# Patient Record
Sex: Male | Born: 1940
Health system: Southern US, Community
[De-identification: ages and names within clinical notes are randomized; demographics above are authoritative.]

## PROBLEM LIST (undated history)

## (undated) DIAGNOSIS — I10 Essential (primary) hypertension: Secondary | ICD-10-CM

## (undated) DIAGNOSIS — K219 Gastro-esophageal reflux disease without esophagitis: Secondary | ICD-10-CM

## (undated) DIAGNOSIS — E7800 Pure hypercholesterolemia, unspecified: Secondary | ICD-10-CM

## (undated) DIAGNOSIS — M1990 Unspecified osteoarthritis, unspecified site: Secondary | ICD-10-CM

## (undated) DIAGNOSIS — E119 Type 2 diabetes mellitus without complications: Secondary | ICD-10-CM

## (undated) HISTORY — PX: COLONOSCOPY: SHX174

## (undated) HISTORY — PX: JOINT REPLACEMENT: SHX530

---

## 2003-02-17 DIAGNOSIS — E139 Other specified diabetes mellitus without complications: Secondary | ICD-10-CM | POA: Insufficient documentation

## 2004-12-26 ENCOUNTER — Ambulatory Visit (HOSPITAL_COMMUNITY): Admission: RE | Admit: 2004-12-26 | Discharge: 2004-12-26 | Payer: Self-pay | Admitting: Gastroenterology

## 2011-09-11 DIAGNOSIS — H2510 Age-related nuclear cataract, unspecified eye: Secondary | ICD-10-CM | POA: Diagnosis not present

## 2011-10-22 DIAGNOSIS — K219 Gastro-esophageal reflux disease without esophagitis: Secondary | ICD-10-CM | POA: Diagnosis not present

## 2011-10-22 DIAGNOSIS — E119 Type 2 diabetes mellitus without complications: Secondary | ICD-10-CM | POA: Diagnosis not present

## 2011-10-22 DIAGNOSIS — E782 Mixed hyperlipidemia: Secondary | ICD-10-CM | POA: Diagnosis not present

## 2011-10-22 DIAGNOSIS — E559 Vitamin D deficiency, unspecified: Secondary | ICD-10-CM | POA: Diagnosis not present

## 2011-10-22 DIAGNOSIS — I10 Essential (primary) hypertension: Secondary | ICD-10-CM | POA: Diagnosis not present

## 2011-10-22 DIAGNOSIS — R809 Proteinuria, unspecified: Secondary | ICD-10-CM | POA: Diagnosis not present

## 2011-10-22 DIAGNOSIS — T783XXA Angioneurotic edema, initial encounter: Secondary | ICD-10-CM | POA: Diagnosis not present

## 2011-10-24 DIAGNOSIS — K219 Gastro-esophageal reflux disease without esophagitis: Secondary | ICD-10-CM | POA: Diagnosis not present

## 2011-10-24 DIAGNOSIS — M169 Osteoarthritis of hip, unspecified: Secondary | ICD-10-CM | POA: Diagnosis not present

## 2011-10-24 DIAGNOSIS — I10 Essential (primary) hypertension: Secondary | ICD-10-CM | POA: Diagnosis not present

## 2011-10-24 DIAGNOSIS — E119 Type 2 diabetes mellitus without complications: Secondary | ICD-10-CM | POA: Diagnosis not present

## 2011-10-24 DIAGNOSIS — E782 Mixed hyperlipidemia: Secondary | ICD-10-CM | POA: Diagnosis not present

## 2011-10-24 DIAGNOSIS — M1710 Unilateral primary osteoarthritis, unspecified knee: Secondary | ICD-10-CM | POA: Diagnosis not present

## 2012-03-12 DIAGNOSIS — H2510 Age-related nuclear cataract, unspecified eye: Secondary | ICD-10-CM | POA: Diagnosis not present

## 2012-03-30 DIAGNOSIS — D1039 Benign neoplasm of other parts of mouth: Secondary | ICD-10-CM | POA: Diagnosis not present

## 2012-04-06 DIAGNOSIS — D1039 Benign neoplasm of other parts of mouth: Secondary | ICD-10-CM | POA: Diagnosis not present

## 2012-04-11 DIAGNOSIS — D1039 Benign neoplasm of other parts of mouth: Secondary | ICD-10-CM | POA: Diagnosis not present

## 2012-04-24 DIAGNOSIS — M1710 Unilateral primary osteoarthritis, unspecified knee: Secondary | ICD-10-CM | POA: Diagnosis not present

## 2012-04-24 DIAGNOSIS — M169 Osteoarthritis of hip, unspecified: Secondary | ICD-10-CM | POA: Diagnosis not present

## 2012-04-24 DIAGNOSIS — E782 Mixed hyperlipidemia: Secondary | ICD-10-CM | POA: Diagnosis not present

## 2012-04-24 DIAGNOSIS — I10 Essential (primary) hypertension: Secondary | ICD-10-CM | POA: Diagnosis not present

## 2012-04-24 DIAGNOSIS — E119 Type 2 diabetes mellitus without complications: Secondary | ICD-10-CM | POA: Diagnosis not present

## 2012-04-24 DIAGNOSIS — K219 Gastro-esophageal reflux disease without esophagitis: Secondary | ICD-10-CM | POA: Diagnosis not present

## 2012-04-28 DIAGNOSIS — Z23 Encounter for immunization: Secondary | ICD-10-CM | POA: Diagnosis not present

## 2012-04-28 DIAGNOSIS — E782 Mixed hyperlipidemia: Secondary | ICD-10-CM | POA: Diagnosis not present

## 2012-04-28 DIAGNOSIS — H269 Unspecified cataract: Secondary | ICD-10-CM | POA: Diagnosis not present

## 2012-04-28 DIAGNOSIS — E119 Type 2 diabetes mellitus without complications: Secondary | ICD-10-CM | POA: Diagnosis not present

## 2012-04-28 DIAGNOSIS — K219 Gastro-esophageal reflux disease without esophagitis: Secondary | ICD-10-CM | POA: Diagnosis not present

## 2012-04-28 DIAGNOSIS — B360 Pityriasis versicolor: Secondary | ICD-10-CM | POA: Diagnosis not present

## 2012-04-28 DIAGNOSIS — I10 Essential (primary) hypertension: Secondary | ICD-10-CM | POA: Diagnosis not present

## 2012-07-02 DIAGNOSIS — H2510 Age-related nuclear cataract, unspecified eye: Secondary | ICD-10-CM | POA: Diagnosis not present

## 2012-07-13 DIAGNOSIS — H2510 Age-related nuclear cataract, unspecified eye: Secondary | ICD-10-CM | POA: Diagnosis not present

## 2012-08-05 DIAGNOSIS — H2589 Other age-related cataract: Secondary | ICD-10-CM | POA: Diagnosis not present

## 2012-08-05 DIAGNOSIS — H269 Unspecified cataract: Secondary | ICD-10-CM | POA: Diagnosis not present

## 2012-08-05 DIAGNOSIS — H2510 Age-related nuclear cataract, unspecified eye: Secondary | ICD-10-CM | POA: Diagnosis not present

## 2012-09-17 DIAGNOSIS — H2510 Age-related nuclear cataract, unspecified eye: Secondary | ICD-10-CM | POA: Diagnosis not present

## 2012-09-28 DIAGNOSIS — H269 Unspecified cataract: Secondary | ICD-10-CM | POA: Diagnosis not present

## 2012-09-28 DIAGNOSIS — H2589 Other age-related cataract: Secondary | ICD-10-CM | POA: Diagnosis not present

## 2012-09-28 DIAGNOSIS — H2510 Age-related nuclear cataract, unspecified eye: Secondary | ICD-10-CM | POA: Diagnosis not present

## 2012-10-13 DIAGNOSIS — R071 Chest pain on breathing: Secondary | ICD-10-CM | POA: Diagnosis not present

## 2012-10-13 DIAGNOSIS — Z0184 Encounter for antibody response examination: Secondary | ICD-10-CM | POA: Diagnosis not present

## 2012-10-21 DIAGNOSIS — R071 Chest pain on breathing: Secondary | ICD-10-CM | POA: Diagnosis not present

## 2012-10-21 DIAGNOSIS — E119 Type 2 diabetes mellitus without complications: Secondary | ICD-10-CM | POA: Diagnosis not present

## 2012-10-21 DIAGNOSIS — E782 Mixed hyperlipidemia: Secondary | ICD-10-CM | POA: Diagnosis not present

## 2012-10-26 DIAGNOSIS — R809 Proteinuria, unspecified: Secondary | ICD-10-CM | POA: Diagnosis not present

## 2012-10-26 DIAGNOSIS — M169 Osteoarthritis of hip, unspecified: Secondary | ICD-10-CM | POA: Diagnosis not present

## 2012-10-26 DIAGNOSIS — R071 Chest pain on breathing: Secondary | ICD-10-CM | POA: Diagnosis not present

## 2012-10-26 DIAGNOSIS — K219 Gastro-esophageal reflux disease without esophagitis: Secondary | ICD-10-CM | POA: Diagnosis not present

## 2012-10-26 DIAGNOSIS — M1710 Unilateral primary osteoarthritis, unspecified knee: Secondary | ICD-10-CM | POA: Diagnosis not present

## 2012-10-26 DIAGNOSIS — I10 Essential (primary) hypertension: Secondary | ICD-10-CM | POA: Diagnosis not present

## 2012-10-26 DIAGNOSIS — E782 Mixed hyperlipidemia: Secondary | ICD-10-CM | POA: Diagnosis not present

## 2013-01-26 DIAGNOSIS — R809 Proteinuria, unspecified: Secondary | ICD-10-CM | POA: Diagnosis not present

## 2013-05-03 DIAGNOSIS — R809 Proteinuria, unspecified: Secondary | ICD-10-CM | POA: Diagnosis not present

## 2013-05-03 DIAGNOSIS — E119 Type 2 diabetes mellitus without complications: Secondary | ICD-10-CM | POA: Diagnosis not present

## 2013-05-03 DIAGNOSIS — R071 Chest pain on breathing: Secondary | ICD-10-CM | POA: Diagnosis not present

## 2013-05-03 DIAGNOSIS — K219 Gastro-esophageal reflux disease without esophagitis: Secondary | ICD-10-CM | POA: Diagnosis not present

## 2013-05-03 DIAGNOSIS — E559 Vitamin D deficiency, unspecified: Secondary | ICD-10-CM | POA: Diagnosis not present

## 2013-05-03 DIAGNOSIS — I10 Essential (primary) hypertension: Secondary | ICD-10-CM | POA: Diagnosis not present

## 2013-05-03 DIAGNOSIS — E782 Mixed hyperlipidemia: Secondary | ICD-10-CM | POA: Diagnosis not present

## 2013-05-05 DIAGNOSIS — E119 Type 2 diabetes mellitus without complications: Secondary | ICD-10-CM | POA: Diagnosis not present

## 2013-05-10 DIAGNOSIS — Z86010 Personal history of colonic polyps: Secondary | ICD-10-CM | POA: Diagnosis not present

## 2013-05-10 DIAGNOSIS — Z23 Encounter for immunization: Secondary | ICD-10-CM | POA: Diagnosis not present

## 2013-05-10 DIAGNOSIS — Z125 Encounter for screening for malignant neoplasm of prostate: Secondary | ICD-10-CM | POA: Diagnosis not present

## 2013-05-10 DIAGNOSIS — Z0000 Encounter for general adult medical examination without abnormal findings: Secondary | ICD-10-CM | POA: Diagnosis not present

## 2013-05-10 DIAGNOSIS — I10 Essential (primary) hypertension: Secondary | ICD-10-CM | POA: Diagnosis not present

## 2013-05-10 DIAGNOSIS — E782 Mixed hyperlipidemia: Secondary | ICD-10-CM | POA: Diagnosis not present

## 2013-05-10 DIAGNOSIS — E119 Type 2 diabetes mellitus without complications: Secondary | ICD-10-CM | POA: Diagnosis not present

## 2013-05-10 DIAGNOSIS — K219 Gastro-esophageal reflux disease without esophagitis: Secondary | ICD-10-CM | POA: Diagnosis not present

## 2013-05-14 DIAGNOSIS — H33329 Round hole, unspecified eye: Secondary | ICD-10-CM | POA: Diagnosis not present

## 2013-05-14 DIAGNOSIS — H35419 Lattice degeneration of retina, unspecified eye: Secondary | ICD-10-CM | POA: Diagnosis not present

## 2013-05-14 DIAGNOSIS — H43819 Vitreous degeneration, unspecified eye: Secondary | ICD-10-CM | POA: Diagnosis not present

## 2013-05-25 DIAGNOSIS — H33329 Round hole, unspecified eye: Secondary | ICD-10-CM | POA: Diagnosis not present

## 2013-06-08 DIAGNOSIS — H33329 Round hole, unspecified eye: Secondary | ICD-10-CM | POA: Diagnosis not present

## 2013-06-22 DIAGNOSIS — H33329 Round hole, unspecified eye: Secondary | ICD-10-CM | POA: Diagnosis not present

## 2013-11-01 DIAGNOSIS — I10 Essential (primary) hypertension: Secondary | ICD-10-CM | POA: Diagnosis not present

## 2013-11-01 DIAGNOSIS — E559 Vitamin D deficiency, unspecified: Secondary | ICD-10-CM | POA: Diagnosis not present

## 2013-11-01 DIAGNOSIS — E782 Mixed hyperlipidemia: Secondary | ICD-10-CM | POA: Diagnosis not present

## 2013-11-01 DIAGNOSIS — E119 Type 2 diabetes mellitus without complications: Secondary | ICD-10-CM | POA: Diagnosis not present

## 2013-11-01 DIAGNOSIS — IMO0001 Reserved for inherently not codable concepts without codable children: Secondary | ICD-10-CM | POA: Diagnosis not present

## 2013-11-01 DIAGNOSIS — M1610 Unilateral primary osteoarthritis, unspecified hip: Secondary | ICD-10-CM | POA: Diagnosis not present

## 2013-11-01 DIAGNOSIS — R809 Proteinuria, unspecified: Secondary | ICD-10-CM | POA: Diagnosis not present

## 2013-11-01 DIAGNOSIS — M169 Osteoarthritis of hip, unspecified: Secondary | ICD-10-CM | POA: Diagnosis not present

## 2013-11-04 DIAGNOSIS — H26499 Other secondary cataract, unspecified eye: Secondary | ICD-10-CM | POA: Diagnosis not present

## 2013-11-04 DIAGNOSIS — E119 Type 2 diabetes mellitus without complications: Secondary | ICD-10-CM | POA: Diagnosis not present

## 2013-11-10 DIAGNOSIS — E119 Type 2 diabetes mellitus without complications: Secondary | ICD-10-CM | POA: Diagnosis not present

## 2013-11-10 DIAGNOSIS — E782 Mixed hyperlipidemia: Secondary | ICD-10-CM | POA: Diagnosis not present

## 2013-11-10 DIAGNOSIS — M1710 Unilateral primary osteoarthritis, unspecified knee: Secondary | ICD-10-CM | POA: Diagnosis not present

## 2013-11-10 DIAGNOSIS — Z23 Encounter for immunization: Secondary | ICD-10-CM | POA: Diagnosis not present

## 2013-11-10 DIAGNOSIS — K219 Gastro-esophageal reflux disease without esophagitis: Secondary | ICD-10-CM | POA: Diagnosis not present

## 2013-11-10 DIAGNOSIS — I10 Essential (primary) hypertension: Secondary | ICD-10-CM | POA: Diagnosis not present

## 2014-01-07 DIAGNOSIS — J209 Acute bronchitis, unspecified: Secondary | ICD-10-CM | POA: Diagnosis not present

## 2014-01-13 DIAGNOSIS — J329 Chronic sinusitis, unspecified: Secondary | ICD-10-CM | POA: Diagnosis not present

## 2014-01-17 DIAGNOSIS — S30860A Insect bite (nonvenomous) of lower back and pelvis, initial encounter: Secondary | ICD-10-CM | POA: Diagnosis not present

## 2014-01-17 DIAGNOSIS — R1310 Dysphagia, unspecified: Secondary | ICD-10-CM | POA: Diagnosis not present

## 2014-01-26 DIAGNOSIS — R1314 Dysphagia, pharyngoesophageal phase: Secondary | ICD-10-CM | POA: Diagnosis not present

## 2014-02-03 DIAGNOSIS — K2100 Gastro-esophageal reflux disease with esophagitis, without bleeding: Secondary | ICD-10-CM | POA: Diagnosis not present

## 2014-02-03 DIAGNOSIS — R1310 Dysphagia, unspecified: Secondary | ICD-10-CM | POA: Diagnosis not present

## 2014-02-03 DIAGNOSIS — K222 Esophageal obstruction: Secondary | ICD-10-CM | POA: Diagnosis not present

## 2014-04-11 DIAGNOSIS — J069 Acute upper respiratory infection, unspecified: Secondary | ICD-10-CM | POA: Diagnosis not present

## 2014-04-26 DIAGNOSIS — J40 Bronchitis, not specified as acute or chronic: Secondary | ICD-10-CM | POA: Diagnosis not present

## 2014-05-09 DIAGNOSIS — I10 Essential (primary) hypertension: Secondary | ICD-10-CM | POA: Diagnosis not present

## 2014-05-09 DIAGNOSIS — Z0000 Encounter for general adult medical examination without abnormal findings: Secondary | ICD-10-CM | POA: Diagnosis not present

## 2014-05-09 DIAGNOSIS — R809 Proteinuria, unspecified: Secondary | ICD-10-CM | POA: Diagnosis not present

## 2014-05-09 DIAGNOSIS — R82998 Other abnormal findings in urine: Secondary | ICD-10-CM | POA: Diagnosis not present

## 2014-05-09 DIAGNOSIS — E782 Mixed hyperlipidemia: Secondary | ICD-10-CM | POA: Diagnosis not present

## 2014-05-09 DIAGNOSIS — E559 Vitamin D deficiency, unspecified: Secondary | ICD-10-CM | POA: Diagnosis not present

## 2014-05-09 DIAGNOSIS — E119 Type 2 diabetes mellitus without complications: Secondary | ICD-10-CM | POA: Diagnosis not present

## 2014-05-09 DIAGNOSIS — IMO0001 Reserved for inherently not codable concepts without codable children: Secondary | ICD-10-CM | POA: Diagnosis not present

## 2014-05-13 DIAGNOSIS — I10 Essential (primary) hypertension: Secondary | ICD-10-CM | POA: Diagnosis not present

## 2014-05-13 DIAGNOSIS — K219 Gastro-esophageal reflux disease without esophagitis: Secondary | ICD-10-CM | POA: Diagnosis not present

## 2014-05-13 DIAGNOSIS — E782 Mixed hyperlipidemia: Secondary | ICD-10-CM | POA: Diagnosis not present

## 2014-05-13 DIAGNOSIS — Z0000 Encounter for general adult medical examination without abnormal findings: Secondary | ICD-10-CM | POA: Diagnosis not present

## 2014-05-13 DIAGNOSIS — Z86010 Personal history of colonic polyps: Secondary | ICD-10-CM | POA: Diagnosis not present

## 2014-05-13 DIAGNOSIS — I861 Scrotal varices: Secondary | ICD-10-CM | POA: Diagnosis not present

## 2014-05-13 DIAGNOSIS — Z23 Encounter for immunization: Secondary | ICD-10-CM | POA: Diagnosis not present

## 2014-05-13 DIAGNOSIS — Z1331 Encounter for screening for depression: Secondary | ICD-10-CM | POA: Diagnosis not present

## 2014-05-13 DIAGNOSIS — E119 Type 2 diabetes mellitus without complications: Secondary | ICD-10-CM | POA: Diagnosis not present

## 2014-11-18 DIAGNOSIS — E1165 Type 2 diabetes mellitus with hyperglycemia: Secondary | ICD-10-CM | POA: Diagnosis not present

## 2014-11-18 DIAGNOSIS — B360 Pityriasis versicolor: Secondary | ICD-10-CM | POA: Diagnosis not present

## 2014-11-18 DIAGNOSIS — E119 Type 2 diabetes mellitus without complications: Secondary | ICD-10-CM | POA: Diagnosis not present

## 2014-11-18 DIAGNOSIS — I10 Essential (primary) hypertension: Secondary | ICD-10-CM | POA: Diagnosis not present

## 2014-11-18 DIAGNOSIS — K219 Gastro-esophageal reflux disease without esophagitis: Secondary | ICD-10-CM | POA: Diagnosis not present

## 2014-11-18 DIAGNOSIS — E782 Mixed hyperlipidemia: Secondary | ICD-10-CM | POA: Diagnosis not present

## 2014-11-18 DIAGNOSIS — Z86010 Personal history of colonic polyps: Secondary | ICD-10-CM | POA: Diagnosis not present

## 2014-11-23 DIAGNOSIS — E782 Mixed hyperlipidemia: Secondary | ICD-10-CM | POA: Diagnosis not present

## 2014-11-23 DIAGNOSIS — I10 Essential (primary) hypertension: Secondary | ICD-10-CM | POA: Diagnosis not present

## 2014-11-23 DIAGNOSIS — E119 Type 2 diabetes mellitus without complications: Secondary | ICD-10-CM | POA: Diagnosis not present

## 2014-11-23 DIAGNOSIS — Z86010 Personal history of colonic polyps: Secondary | ICD-10-CM | POA: Diagnosis not present

## 2014-11-23 DIAGNOSIS — M179 Osteoarthritis of knee, unspecified: Secondary | ICD-10-CM | POA: Diagnosis not present

## 2014-11-23 DIAGNOSIS — K219 Gastro-esophageal reflux disease without esophagitis: Secondary | ICD-10-CM | POA: Diagnosis not present

## 2014-12-13 DIAGNOSIS — H26493 Other secondary cataract, bilateral: Secondary | ICD-10-CM | POA: Diagnosis not present

## 2014-12-13 DIAGNOSIS — H35033 Hypertensive retinopathy, bilateral: Secondary | ICD-10-CM | POA: Diagnosis not present

## 2014-12-13 DIAGNOSIS — Z961 Presence of intraocular lens: Secondary | ICD-10-CM | POA: Diagnosis not present

## 2014-12-13 DIAGNOSIS — E119 Type 2 diabetes mellitus without complications: Secondary | ICD-10-CM | POA: Diagnosis not present

## 2015-03-20 DIAGNOSIS — Z09 Encounter for follow-up examination after completed treatment for conditions other than malignant neoplasm: Secondary | ICD-10-CM | POA: Diagnosis not present

## 2015-03-20 DIAGNOSIS — Z86010 Personal history of colonic polyps: Secondary | ICD-10-CM | POA: Diagnosis not present

## 2015-05-29 DIAGNOSIS — E1165 Type 2 diabetes mellitus with hyperglycemia: Secondary | ICD-10-CM | POA: Diagnosis not present

## 2015-05-29 DIAGNOSIS — I10 Essential (primary) hypertension: Secondary | ICD-10-CM | POA: Diagnosis not present

## 2015-05-29 DIAGNOSIS — B360 Pityriasis versicolor: Secondary | ICD-10-CM | POA: Diagnosis not present

## 2015-05-29 DIAGNOSIS — K219 Gastro-esophageal reflux disease without esophagitis: Secondary | ICD-10-CM | POA: Diagnosis not present

## 2015-05-29 DIAGNOSIS — E782 Mixed hyperlipidemia: Secondary | ICD-10-CM | POA: Diagnosis not present

## 2015-05-29 DIAGNOSIS — Z86010 Personal history of colonic polyps: Secondary | ICD-10-CM | POA: Diagnosis not present

## 2015-05-31 DIAGNOSIS — Z0000 Encounter for general adult medical examination without abnormal findings: Secondary | ICD-10-CM | POA: Diagnosis not present

## 2015-05-31 DIAGNOSIS — K219 Gastro-esophageal reflux disease without esophagitis: Secondary | ICD-10-CM | POA: Diagnosis not present

## 2015-05-31 DIAGNOSIS — Z1389 Encounter for screening for other disorder: Secondary | ICD-10-CM | POA: Diagnosis not present

## 2015-05-31 DIAGNOSIS — I10 Essential (primary) hypertension: Secondary | ICD-10-CM | POA: Diagnosis not present

## 2015-05-31 DIAGNOSIS — R809 Proteinuria, unspecified: Secondary | ICD-10-CM | POA: Diagnosis not present

## 2015-05-31 DIAGNOSIS — Z7189 Other specified counseling: Secondary | ICD-10-CM | POA: Diagnosis not present

## 2015-05-31 DIAGNOSIS — Z23 Encounter for immunization: Secondary | ICD-10-CM | POA: Diagnosis not present

## 2015-05-31 DIAGNOSIS — M179 Osteoarthritis of knee, unspecified: Secondary | ICD-10-CM | POA: Diagnosis not present

## 2015-05-31 DIAGNOSIS — E11319 Type 2 diabetes mellitus with unspecified diabetic retinopathy without macular edema: Secondary | ICD-10-CM | POA: Diagnosis not present

## 2015-05-31 DIAGNOSIS — E782 Mixed hyperlipidemia: Secondary | ICD-10-CM | POA: Diagnosis not present

## 2015-06-05 DIAGNOSIS — M170 Bilateral primary osteoarthritis of knee: Secondary | ICD-10-CM | POA: Diagnosis not present

## 2015-06-05 DIAGNOSIS — M25551 Pain in right hip: Secondary | ICD-10-CM | POA: Diagnosis not present

## 2015-06-15 DIAGNOSIS — M545 Low back pain: Secondary | ICD-10-CM | POA: Diagnosis not present

## 2015-06-19 DIAGNOSIS — M545 Low back pain: Secondary | ICD-10-CM | POA: Diagnosis not present

## 2015-06-22 DIAGNOSIS — M545 Low back pain: Secondary | ICD-10-CM | POA: Diagnosis not present

## 2015-06-27 DIAGNOSIS — M9963 Osseous and subluxation stenosis of intervertebral foramina of lumbar region: Secondary | ICD-10-CM | POA: Diagnosis not present

## 2015-06-27 DIAGNOSIS — M545 Low back pain: Secondary | ICD-10-CM | POA: Diagnosis not present

## 2015-06-27 DIAGNOSIS — M170 Bilateral primary osteoarthritis of knee: Secondary | ICD-10-CM | POA: Diagnosis not present

## 2015-06-30 DIAGNOSIS — M545 Low back pain: Secondary | ICD-10-CM | POA: Diagnosis not present

## 2015-06-30 DIAGNOSIS — M9963 Osseous and subluxation stenosis of intervertebral foramina of lumbar region: Secondary | ICD-10-CM | POA: Diagnosis not present

## 2015-07-18 DIAGNOSIS — M9963 Osseous and subluxation stenosis of intervertebral foramina of lumbar region: Secondary | ICD-10-CM | POA: Diagnosis not present

## 2015-07-18 DIAGNOSIS — M545 Low back pain: Secondary | ICD-10-CM | POA: Diagnosis not present

## 2015-11-09 DIAGNOSIS — J209 Acute bronchitis, unspecified: Secondary | ICD-10-CM | POA: Diagnosis not present

## 2015-12-13 DIAGNOSIS — H5213 Myopia, bilateral: Secondary | ICD-10-CM | POA: Diagnosis not present

## 2015-12-13 DIAGNOSIS — H35033 Hypertensive retinopathy, bilateral: Secondary | ICD-10-CM | POA: Diagnosis not present

## 2015-12-13 DIAGNOSIS — E119 Type 2 diabetes mellitus without complications: Secondary | ICD-10-CM | POA: Diagnosis not present

## 2015-12-13 DIAGNOSIS — H26493 Other secondary cataract, bilateral: Secondary | ICD-10-CM | POA: Diagnosis not present

## 2015-12-13 DIAGNOSIS — H524 Presbyopia: Secondary | ICD-10-CM | POA: Diagnosis not present

## 2016-01-02 DIAGNOSIS — Z7984 Long term (current) use of oral hypoglycemic drugs: Secondary | ICD-10-CM | POA: Diagnosis not present

## 2016-01-02 DIAGNOSIS — M179 Osteoarthritis of knee, unspecified: Secondary | ICD-10-CM | POA: Diagnosis not present

## 2016-01-02 DIAGNOSIS — E11319 Type 2 diabetes mellitus with unspecified diabetic retinopathy without macular edema: Secondary | ICD-10-CM | POA: Diagnosis not present

## 2016-01-02 DIAGNOSIS — K219 Gastro-esophageal reflux disease without esophagitis: Secondary | ICD-10-CM | POA: Diagnosis not present

## 2016-01-02 DIAGNOSIS — I10 Essential (primary) hypertension: Secondary | ICD-10-CM | POA: Diagnosis not present

## 2016-01-02 DIAGNOSIS — Z0000 Encounter for general adult medical examination without abnormal findings: Secondary | ICD-10-CM | POA: Diagnosis not present

## 2016-01-02 DIAGNOSIS — Z7189 Other specified counseling: Secondary | ICD-10-CM | POA: Diagnosis not present

## 2016-01-02 DIAGNOSIS — Z23 Encounter for immunization: Secondary | ICD-10-CM | POA: Diagnosis not present

## 2016-01-02 DIAGNOSIS — E782 Mixed hyperlipidemia: Secondary | ICD-10-CM | POA: Diagnosis not present

## 2016-01-02 DIAGNOSIS — R809 Proteinuria, unspecified: Secondary | ICD-10-CM | POA: Diagnosis not present

## 2016-01-02 DIAGNOSIS — Z1389 Encounter for screening for other disorder: Secondary | ICD-10-CM | POA: Diagnosis not present

## 2016-01-05 DIAGNOSIS — K219 Gastro-esophageal reflux disease without esophagitis: Secondary | ICD-10-CM | POA: Diagnosis not present

## 2016-01-05 DIAGNOSIS — I10 Essential (primary) hypertension: Secondary | ICD-10-CM | POA: Diagnosis not present

## 2016-01-05 DIAGNOSIS — E11319 Type 2 diabetes mellitus with unspecified diabetic retinopathy without macular edema: Secondary | ICD-10-CM | POA: Diagnosis not present

## 2016-01-05 DIAGNOSIS — Z7984 Long term (current) use of oral hypoglycemic drugs: Secondary | ICD-10-CM | POA: Diagnosis not present

## 2016-01-05 DIAGNOSIS — E782 Mixed hyperlipidemia: Secondary | ICD-10-CM | POA: Diagnosis not present

## 2016-01-05 DIAGNOSIS — M179 Osteoarthritis of knee, unspecified: Secondary | ICD-10-CM | POA: Diagnosis not present

## 2016-01-05 DIAGNOSIS — Z86010 Personal history of colonic polyps: Secondary | ICD-10-CM | POA: Diagnosis not present

## 2016-01-05 DIAGNOSIS — R809 Proteinuria, unspecified: Secondary | ICD-10-CM | POA: Diagnosis not present

## 2016-03-14 DIAGNOSIS — T63481A Toxic effect of venom of other arthropod, accidental (unintentional), initial encounter: Secondary | ICD-10-CM | POA: Diagnosis not present

## 2016-03-14 DIAGNOSIS — S40261A Insect bite (nonvenomous) of right shoulder, initial encounter: Secondary | ICD-10-CM | POA: Diagnosis not present

## 2016-06-03 DIAGNOSIS — E782 Mixed hyperlipidemia: Secondary | ICD-10-CM | POA: Diagnosis not present

## 2016-06-03 DIAGNOSIS — R809 Proteinuria, unspecified: Secondary | ICD-10-CM | POA: Diagnosis not present

## 2016-06-03 DIAGNOSIS — Z1389 Encounter for screening for other disorder: Secondary | ICD-10-CM | POA: Diagnosis not present

## 2016-06-03 DIAGNOSIS — I10 Essential (primary) hypertension: Secondary | ICD-10-CM | POA: Diagnosis not present

## 2016-06-03 DIAGNOSIS — E11319 Type 2 diabetes mellitus with unspecified diabetic retinopathy without macular edema: Secondary | ICD-10-CM | POA: Diagnosis not present

## 2016-06-03 DIAGNOSIS — Z23 Encounter for immunization: Secondary | ICD-10-CM | POA: Diagnosis not present

## 2016-06-03 DIAGNOSIS — K219 Gastro-esophageal reflux disease without esophagitis: Secondary | ICD-10-CM | POA: Diagnosis not present

## 2016-06-03 DIAGNOSIS — Z7984 Long term (current) use of oral hypoglycemic drugs: Secondary | ICD-10-CM | POA: Diagnosis not present

## 2016-06-03 DIAGNOSIS — Z7189 Other specified counseling: Secondary | ICD-10-CM | POA: Diagnosis not present

## 2016-06-03 DIAGNOSIS — Z0000 Encounter for general adult medical examination without abnormal findings: Secondary | ICD-10-CM | POA: Diagnosis not present

## 2016-06-03 DIAGNOSIS — M179 Osteoarthritis of knee, unspecified: Secondary | ICD-10-CM | POA: Diagnosis not present

## 2016-06-05 DIAGNOSIS — E11319 Type 2 diabetes mellitus with unspecified diabetic retinopathy without macular edema: Secondary | ICD-10-CM | POA: Diagnosis not present

## 2016-06-05 DIAGNOSIS — Z7984 Long term (current) use of oral hypoglycemic drugs: Secondary | ICD-10-CM | POA: Diagnosis not present

## 2016-06-05 DIAGNOSIS — E782 Mixed hyperlipidemia: Secondary | ICD-10-CM | POA: Diagnosis not present

## 2016-06-05 DIAGNOSIS — Z86010 Personal history of colonic polyps: Secondary | ICD-10-CM | POA: Diagnosis not present

## 2016-06-05 DIAGNOSIS — Z0000 Encounter for general adult medical examination without abnormal findings: Secondary | ICD-10-CM | POA: Diagnosis not present

## 2016-06-05 DIAGNOSIS — Z1389 Encounter for screening for other disorder: Secondary | ICD-10-CM | POA: Diagnosis not present

## 2016-06-05 DIAGNOSIS — Z23 Encounter for immunization: Secondary | ICD-10-CM | POA: Diagnosis not present

## 2016-06-05 DIAGNOSIS — R809 Proteinuria, unspecified: Secondary | ICD-10-CM | POA: Diagnosis not present

## 2016-06-05 DIAGNOSIS — K219 Gastro-esophageal reflux disease without esophagitis: Secondary | ICD-10-CM | POA: Diagnosis not present

## 2016-06-05 DIAGNOSIS — M169 Osteoarthritis of hip, unspecified: Secondary | ICD-10-CM | POA: Diagnosis not present

## 2016-06-05 DIAGNOSIS — I10 Essential (primary) hypertension: Secondary | ICD-10-CM | POA: Diagnosis not present

## 2016-07-08 DIAGNOSIS — K219 Gastro-esophageal reflux disease without esophagitis: Secondary | ICD-10-CM | POA: Diagnosis not present

## 2016-08-16 DIAGNOSIS — K219 Gastro-esophageal reflux disease without esophagitis: Secondary | ICD-10-CM | POA: Diagnosis not present

## 2016-09-11 ENCOUNTER — Ambulatory Visit (INDEPENDENT_AMBULATORY_CARE_PROVIDER_SITE_OTHER): Payer: Medicare Other

## 2016-09-11 ENCOUNTER — Ambulatory Visit (INDEPENDENT_AMBULATORY_CARE_PROVIDER_SITE_OTHER): Payer: Self-pay

## 2016-09-11 ENCOUNTER — Ambulatory Visit (INDEPENDENT_AMBULATORY_CARE_PROVIDER_SITE_OTHER): Payer: Medicare Other | Admitting: Orthopaedic Surgery

## 2016-09-11 ENCOUNTER — Encounter (INDEPENDENT_AMBULATORY_CARE_PROVIDER_SITE_OTHER): Payer: Self-pay | Admitting: Orthopaedic Surgery

## 2016-09-11 VITALS — Ht 69.0 in | Wt 185.0 lb

## 2016-09-11 DIAGNOSIS — M25562 Pain in left knee: Secondary | ICD-10-CM

## 2016-09-11 DIAGNOSIS — M1611 Unilateral primary osteoarthritis, right hip: Secondary | ICD-10-CM | POA: Diagnosis not present

## 2016-09-11 DIAGNOSIS — M25561 Pain in right knee: Secondary | ICD-10-CM

## 2016-09-11 DIAGNOSIS — M25551 Pain in right hip: Secondary | ICD-10-CM | POA: Diagnosis not present

## 2016-09-11 MED ORDER — LIDOCAINE HCL 1 % IJ SOLN
3.0000 mL | INTRAMUSCULAR | Status: AC | PRN
  Administered 2016-09-11: 3 mL

## 2016-09-11 MED ORDER — METHYLPREDNISOLONE ACETATE 40 MG/ML IJ SUSP
40.0000 mg | INTRAMUSCULAR | Status: AC | PRN
  Administered 2016-09-11: 40 mg via INTRA_ARTICULAR

## 2016-09-11 NOTE — Progress Notes (Signed)
Office Visit Note   Patient: Alex Carlson           Date of Birth: 12-15-1940           MRN: [id number] Visit Date: 09/11/2016              Requested by: No referring provider defined for this encounter. PCP: No primary care provider on file.   Assessment & Plan: Visit Diagnoses:  1. Pain in right hip   2. Acute pain of left knee   3. Acute pain of right knee   4. Unilateral primary osteoarthritis, right hip     Plan: At this point I did show him a hip model and spine in detail with the disease process is. We went over his x-rays as well. He does wish proceed with a total hip arthroplasty we've recommended this to him. We explained in great detail the risk of acute blood loss anemia, nerve and vessel injury, fracture, infection, dislocation, DVT. We talked about our goals are decreased pain, improve mobility, and improved quality of life. He does wish to have this set up in the near future. He will watch his blood glucose closely given the injections in his knees. He is definitely incision the surgery and we will see him back in 2 weeks postoperative but no x-rays are needed.  Follow-Up Instructions: Return for 2 weeks post-op.   Orders:  Orders Placed This Encounter  Procedures  . XR HIP UNILAT W OR W/O PELVIS 2-3 VIEWS RIGHT  . XR Knee 1-2 Views Left  . XR Knee 1-2 Views Right   No orders of the defined types were placed in this encounter.     Procedures: Large Joint Inj Date/Time: 09/11/2016 9:31 AM Performed by: Mcarthur Rossetti Authorized by: Jean Rosenthal Y   Location:  Knee Site:  R knee Ultrasound Guidance: No   Fluoroscopic Guidance: No   Arthrogram: No   Medications:  3 mL lidocaine 1 %; 40 mg methylPREDNISolone acetate 40 MG/ML Large Joint Inj Date/Time: 09/11/2016 9:31 AM Performed by: Mcarthur Rossetti Authorized by: Mcarthur Rossetti   Location:  Knee Site:  L knee Ultrasound Guidance: No   Fluoroscopic Guidance: No     Arthrogram: No   Medications:  3 mL lidocaine 1 %; 40 mg methylPREDNISolone acetate 40 MG/ML     Clinical Data: No additional findings.   Subjective: Chief Complaint  Patient presents with  . Right Hip - Pain    -states tht right hip hurts to walk -Bil. Knee pain, left worse than the right -left knee gives away, right knee has a sharp pain that radiates down into right ankle  . Right Knee - Pain  . Left Knee - Pain    HPI Most his pain seems to be in the right hip. It hurts in the groin. He walks with a significant limp. His pain is 10 out of 10. It's definitely affect his activities daily living, his quality of life, and his mobility. Both his knees have pain and he points the medial side of his knees. He has tried and failed all forms conservative treatment. He has ambulate with a cane and sometimes. It wakes him up at night. Review of Systems He denies any chest pain, shortness of breath, fever, chills, nausea, vomiting  He is a diabetic and is him ago and A1c is below 8.  Objective: Vital Signs: Ht 5\' 9"  (1.753 m)   Wt 185 lb (83.9 kg)  BMI 27.32 kg/m   Physical Exam He is alert and oriented 3 with no acute distress. He and relates with a significant limp. Ortho Exam He has significant stiffness of both hips with the right being worse than left. He has significant limitations with internal/external rotation and severe pain in the groin. His leg lengths appear equal. He may be just be a touch shorter on the right and left. Both knees have no significant patellofemoral crepitation but some slight medial joint line tenderness. There is no effusion in either knee. He has excellent range of motion of both knees. Specialty Comments:  No specialty comments available.  Imaging: Xr Hip Unilat W Or W/o Pelvis 2-3 Views Right  Result Date: 09/11/2016 An AP pelvis and lateral of his right hip shows severe end-stage arthritis. There is complete loss of superior lateral joint  space. There sclerotic and cystic changes in both the femoral head and the acetabulum.  Xr Knee 1-2 Views Left  Result Date: 09/11/2016 An AP and lateral of the left knee shows medial joint space narrowing and moderate try confirm arthritic changes but mainly the medial joint line. There is no effusion in no acute changes.  Xr Knee 1-2 Views Right  Result Date: 09/11/2016 An AP and lateral of his right knee shows moderate try confirm arthritis mainly of the medial compartment. He has a slight varus deformity and no effusion.    PMFS History: There are no active problems to display for this patient.  Past Medical History:  Diagnosis Date  . Cancer [facility])    family  . Diabetes mellitus without complication (HCC)    family    No family history on file.  History reviewed. No pertinent surgical history. Social History   Occupational History  . Not on file.   Social History Main Topics  . Smoking status: Never Smoker  . Smokeless tobacco: Never Used  . Alcohol use Yes  . Drug use: No  . Sexual activity: Not on file

## 2016-09-26 ENCOUNTER — Other Ambulatory Visit (INDEPENDENT_AMBULATORY_CARE_PROVIDER_SITE_OTHER): Payer: Self-pay | Admitting: Physician Assistant

## 2016-09-27 NOTE — Pre-Procedure Instructions (Signed)
Alex Carlson  09/27/2016      [address], [place] [address] Phone: [phone] Fax: [phone]    Your procedure is scheduled on Tuesday February 20.  Report to [facility] Admitting at 11:00 A.M.  Call this number if you have problems the morning of surgery:  [phone]   Remember:  Do not eat food or drink liquids after midnight.  Take these medicines the morning of surgery with A SIP OF WATER: amlodipine (norvasc), acetaminophen (tylenol) if needed  7 days prior to surgery STOP taking any Aspirin, Aleve, Naproxen, Ibuprofen, Motrin, Advil, Goody's, BC's, all herbal medications, fish oil, and all vitamins  WHAT DO I DO ABOUT MY DIABETES MEDICATION?   Marland Kitchen Do not take oral diabetes medicines (pills) the morning of surgery. DO NOT TAKE glipizide or metformin (Glucophage) the day of surgery.     How to Manage Your Diabetes Before and After Surgery  Why is it important to control my blood sugar before and after surgery? . Improving blood sugar levels before and after surgery helps healing and can limit problems. . A way of improving blood sugar control is eating a healthy diet by: o  Eating less sugar and carbohydrates o  Increasing activity/exercise o  Talking with your doctor about reaching your blood sugar goals . High blood sugars (greater than 180 mg/dL) can raise your risk of infections and slow your recovery, so you will need to focus on controlling your diabetes during the weeks before surgery. . Make sure that the doctor who takes care of your diabetes knows about your planned surgery including the date and location.  How do I manage my blood sugar before surgery? . Check your blood sugar at least 4 times a day, starting 2 days before surgery, to make sure that the level is not too high or low. o Check your blood sugar the morning of your surgery when you wake up  and every 2 hours until you get to the Short Stay unit. . If your blood sugar is less than 70 mg/dL, you will need to treat for low blood sugar: o Do not take insulin. o Treat a low blood sugar (less than 70 mg/dL) with  cup of clear juice (cranberry or apple), 4 glucose tablets, OR glucose gel. o Recheck blood sugar in 15 minutes after treatment (to make sure it is greater than 70 mg/dL). If your blood sugar is not greater than 70 mg/dL on recheck, call [phone] for further instructions. . Report your blood sugar to the short stay nurse when you get to Short Stay.  . If you are admitted to the hospital after surgery: o Your blood sugar will be checked by the staff and you will probably be given insulin after surgery (instead of oral diabetes medicines) to make sure you have good blood sugar levels. o The goal for blood sugar control after surgery is 80-180 mg/dL.               Do not wear jewelry, make-up or nail polish.  Do not wear lotions, powders, or perfumes, or deoderant.  Do not shave 48 hours prior to surgery.  Men may shave face and neck.  Do not bring valuables to the hospital.  [facility] is not responsible for any belongings or valuables.  Contacts, dentures or bridgework may not be worn into surgery.  Leave your suitcase in the  car.  After surgery it may be brought to your room.  For patients admitted to the hospital, discharge time will be determined by your treatment team.  Patients discharged the day of surgery will not be allowed to drive home.    Special instructions:    - Preparing For Surgery  Before surgery, you can play an important role. Because skin is not sterile, your skin needs to be as free of germs as possible. You can reduce the number of germs on your skin by washing with CHG (chlorahexidine gluconate) Soap before surgery.  CHG is an antiseptic cleaner which kills germs and bonds with the skin to continue killing germs even after  washing.  Please do not use if you have an allergy to CHG or antibacterial soaps. If your skin becomes reddened/irritated stop using the CHG.  Do not shave (including legs and underarms) for at least 48 hours prior to first CHG shower. It is OK to shave your face.  Please follow these instructions carefully.   1. Shower the NIGHT BEFORE SURGERY and the MORNING OF SURGERY with CHG.   2. If you chose to wash your hair, wash your hair first as usual with your normal shampoo.  3. After you shampoo, rinse your hair and body thoroughly to remove the shampoo.  4. Use CHG as you would any other liquid soap. You can apply CHG directly to the skin and wash gently with a scrungie or a clean washcloth.   5. Apply the CHG Soap to your body ONLY FROM THE NECK DOWN.  Do not use on open wounds or open sores. Avoid contact with your eyes, ears, mouth and genitals (private parts). Wash genitals (private parts) with your normal soap.  6. Wash thoroughly, paying special attention to the area where your surgery will be performed.  7. Thoroughly rinse your body with warm water from the neck down.  8. DO NOT shower/wash with your normal soap after using and rinsing off the CHG Soap.  9. Pat yourself dry with a CLEAN TOWEL.   10. Wear CLEAN PAJAMAS   11. Place CLEAN SHEETS on your bed the night of your first shower and DO NOT SLEEP WITH PETS.    Day of Surgery: Do not apply any deodorants/lotions. Please wear clean clothes to the hospital/surgery center.      Please read over the following fact sheets that you were given. Total Joint Packet and MRSA Information

## 2016-09-30 ENCOUNTER — Encounter (HOSPITAL_COMMUNITY): Payer: Self-pay

## 2016-09-30 ENCOUNTER — Encounter (HOSPITAL_COMMUNITY)
Admission: RE | Admit: 2016-09-30 | Discharge: 2016-09-30 | Disposition: A | Discharge status: Home / Self Care | Payer: Medicare Other | Source: Ambulatory Visit | Attending: Orthopaedic Surgery | Admitting: Orthopaedic Surgery

## 2016-09-30 DIAGNOSIS — E119 Type 2 diabetes mellitus without complications: Secondary | ICD-10-CM | POA: Insufficient documentation

## 2016-09-30 DIAGNOSIS — Z01818 Encounter for other preprocedural examination: Secondary | ICD-10-CM | POA: Insufficient documentation

## 2016-09-30 DIAGNOSIS — M1611 Unilateral primary osteoarthritis, right hip: Secondary | ICD-10-CM | POA: Diagnosis not present

## 2016-09-30 HISTORY — DX: Unspecified osteoarthritis, unspecified site: M19.90

## 2016-09-30 HISTORY — DX: Essential (primary) hypertension: I10

## 2016-09-30 LAB — BASIC METABOLIC PANEL
Anion gap: 8 (ref 5–15)
BUN: 16 mg/dL (ref 6–20)
CO2: 24 mmol/L (ref 22–32)
CREATININE: 0.91 mg/dL (ref 0.61–1.24)
Calcium: 9.6 mg/dL (ref 8.9–10.3)
Chloride: 107 mmol/L (ref 101–111)
GFR calc non Af Amer: >60 mL/min (ref >60)
Glucose, Bld: 137 mg/dL — ABNORMAL HIGH (ref 65–99)
Potassium: 4.1 mmol/L (ref 3.5–5.1)
Sodium: 139 mmol/L (ref 135–145)

## 2016-09-30 LAB — CBC
HCT: 42.6 % (ref 39.0–52.0)
Hemoglobin: 14.2 g/dL (ref 13.0–17.0)
MCH: 29.8 pg (ref 26.0–34.0)
MCHC: 33.3 g/dL (ref 30.0–36.0)
MCV: 89.5 fL (ref 78.0–100.0)
PLATELETS: 225 10*3/uL (ref 150–400)
RBC: 4.76 MIL/uL (ref 4.22–5.81)
RDW: 13.5 % (ref 11.5–15.5)
WBC: 6.1 10*3/uL (ref 4.0–10.5)

## 2016-09-30 LAB — GLUCOSE, CAPILLARY: Glucose-Capillary: 109 mg/dL — ABNORMAL HIGH (ref 65–99)

## 2016-09-30 LAB — SURGICAL PCR SCREEN
MRSA, PCR: NEGATIVE
STAPHYLOCOCCUS AUREUS: POSITIVE — AB

## 2016-09-30 NOTE — Progress Notes (Signed)
Pt PCR positive for MSSA, prescription called to pharmacy and patient notified.

## 2016-09-30 NOTE — Progress Notes (Signed)
PCP: Dr. Cari Caraway with [facility].  Pt denies cardiologist or cardiac hx Pt states he had a stress test in [place] a long time ago but denies cardiac hx, unsure where this was done, but was on the "[place] side"   EKG: 09/30/16  No complaints of chest pain, SOB or signs of infection at PAT appointment.

## 2016-10-01 LAB — HEMOGLOBIN A1C
HEMOGLOBIN A1C: 7.4 % — AB (ref 4.8–5.6)
MEAN PLASMA GLUCOSE: 166 mg/dL

## 2016-10-07 MED ORDER — TRANEXAMIC ACID 1000 MG/10ML IV SOLN
1000.0000 mg | INTRAVENOUS | Status: AC
  Administered 2016-10-08: 1000 mg via INTRAVENOUS
  Filled 2016-10-07: qty 10

## 2016-10-07 MED ORDER — CEFAZOLIN SODIUM-DEXTROSE 2-4 GM/100ML-% IV SOLN
2.0000 g | INTRAVENOUS | Status: AC
Start: 2016-10-08 — End: 2016-10-08
  Administered 2016-10-08: 2 g via INTRAVENOUS
  Filled 2016-10-07: qty 100

## 2016-10-08 ENCOUNTER — Encounter (HOSPITAL_COMMUNITY): Payer: Self-pay | Admitting: Certified Registered Nurse Anesthetist

## 2016-10-08 ENCOUNTER — Encounter (HOSPITAL_COMMUNITY)
Admission: RE | Disposition: A | Discharge status: Home / Self Care | Payer: Self-pay | Source: Ambulatory Visit | Attending: Orthopaedic Surgery

## 2016-10-08 ENCOUNTER — Inpatient Hospital Stay (HOSPITAL_COMMUNITY): Payer: Medicare Other

## 2016-10-08 ENCOUNTER — Inpatient Hospital Stay (HOSPITAL_COMMUNITY): Payer: Medicare Other | Admitting: Certified Registered Nurse Anesthetist

## 2016-10-08 ENCOUNTER — Inpatient Hospital Stay (HOSPITAL_COMMUNITY): Payer: Medicare Other | Admitting: Vascular Surgery

## 2016-10-08 ENCOUNTER — Inpatient Hospital Stay (HOSPITAL_COMMUNITY)
Admission: RE | Admit: 2016-10-08 | Discharge: 2016-10-11 | DRG: 470 | Disposition: A | Discharge status: Home / Self Care | Payer: Medicare Other | Source: Ambulatory Visit | Attending: Orthopaedic Surgery | Admitting: Orthopaedic Surgery

## 2016-10-08 DIAGNOSIS — M1611 Unilateral primary osteoarthritis, right hip: Principal | ICD-10-CM | POA: Diagnosis present

## 2016-10-08 DIAGNOSIS — E119 Type 2 diabetes mellitus without complications: Secondary | ICD-10-CM | POA: Diagnosis not present

## 2016-10-08 DIAGNOSIS — I10 Essential (primary) hypertension: Secondary | ICD-10-CM | POA: Diagnosis not present

## 2016-10-08 DIAGNOSIS — Z888 Allergy status to other drugs, medicaments and biological substances status: Secondary | ICD-10-CM | POA: Diagnosis not present

## 2016-10-08 DIAGNOSIS — E7800 Pure hypercholesterolemia, unspecified: Secondary | ICD-10-CM | POA: Diagnosis present

## 2016-10-08 DIAGNOSIS — Z419 Encounter for procedure for purposes other than remedying health state, unspecified: Secondary | ICD-10-CM

## 2016-10-08 DIAGNOSIS — Z79899 Other long term (current) drug therapy: Secondary | ICD-10-CM | POA: Diagnosis not present

## 2016-10-08 DIAGNOSIS — Z96641 Presence of right artificial hip joint: Secondary | ICD-10-CM

## 2016-10-08 DIAGNOSIS — Z7982 Long term (current) use of aspirin: Secondary | ICD-10-CM | POA: Diagnosis not present

## 2016-10-08 DIAGNOSIS — Z7984 Long term (current) use of oral hypoglycemic drugs: Secondary | ICD-10-CM | POA: Diagnosis not present

## 2016-10-08 DIAGNOSIS — K219 Gastro-esophageal reflux disease without esophagitis: Secondary | ICD-10-CM | POA: Diagnosis present

## 2016-10-08 HISTORY — PX: TOTAL HIP ARTHROPLASTY: SHX124

## 2016-10-08 HISTORY — DX: Type 2 diabetes mellitus without complications: E11.9

## 2016-10-08 HISTORY — DX: Pure hypercholesterolemia, unspecified: E78.00

## 2016-10-08 HISTORY — DX: Gastro-esophageal reflux disease without esophagitis: K21.9

## 2016-10-08 LAB — GLUCOSE, CAPILLARY
GLUCOSE-CAPILLARY: 121 mg/dL — AB (ref 65–99)
GLUCOSE-CAPILLARY: 206 mg/dL — AB (ref 65–99)
Glucose-Capillary: 83 mg/dL (ref 65–99)

## 2016-10-08 SURGERY — ARTHROPLASTY, HIP, TOTAL, ANTERIOR APPROACH
Anesthesia: Spinal | Site: Hip | Laterality: Right

## 2016-10-08 MED ORDER — ACETAMINOPHEN 325 MG PO TABS
650.0000 mg | ORAL_TABLET | Freq: Four times a day (QID) | ORAL | Status: DC | PRN
  Administered 2016-10-09 – 2016-10-11 (×3): 650 mg via ORAL
  Filled 2016-10-08 (×3): qty 2

## 2016-10-08 MED ORDER — BUPIVACAINE IN DEXTROSE 0.75-8.25 % IT SOLN
INTRATHECAL | Status: DC | PRN
  Administered 2016-10-08: 2 mL via INTRATHECAL

## 2016-10-08 MED ORDER — SODIUM CHLORIDE 0.9 % IV SOLN
INTRAVENOUS | Status: DC
  Administered 2016-10-08 – 2016-10-10 (×2): via INTRAVENOUS

## 2016-10-08 MED ORDER — FENTANYL CITRATE (PF) 100 MCG/2ML IJ SOLN
INTRAMUSCULAR | Status: DC | PRN
  Administered 2016-10-08: 50 ug via INTRAVENOUS

## 2016-10-08 MED ORDER — METOCLOPRAMIDE HCL 5 MG/ML IJ SOLN: 5.0000 mg | Freq: Three times a day (TID) | INTRAMUSCULAR | Status: DC | PRN

## 2016-10-08 MED ORDER — INSULIN ASPART 100 UNIT/ML ~~LOC~~ SOLN
0.0000 [IU] | Freq: Three times a day (TID) | SUBCUTANEOUS | Status: DC
  Administered 2016-10-09 (×2): 3 [IU] via SUBCUTANEOUS
  Administered 2016-10-09 – 2016-10-10 (×2): 5 [IU] via SUBCUTANEOUS
  Administered 2016-10-10 – 2016-10-11 (×4): 3 [IU] via SUBCUTANEOUS

## 2016-10-08 MED ORDER — METHOCARBAMOL 1000 MG/10ML IJ SOLN
500.0000 mg | Freq: Four times a day (QID) | INTRAMUSCULAR | Status: DC | PRN
  Filled 2016-10-08: qty 5

## 2016-10-08 MED ORDER — EPHEDRINE 5 MG/ML INJ
INTRAVENOUS | Status: AC
  Filled 2016-10-08: qty 10

## 2016-10-08 MED ORDER — INSULIN ASPART 100 UNIT/ML ~~LOC~~ SOLN
0.0000 [IU] | Freq: Every day | SUBCUTANEOUS | Status: DC
  Administered 2016-10-08: 2 [IU] via SUBCUTANEOUS

## 2016-10-08 MED ORDER — ONDANSETRON HCL 4 MG PO TABS: 4.0000 mg | ORAL_TABLET | Freq: Four times a day (QID) | ORAL | Status: DC | PRN

## 2016-10-08 MED ORDER — ZOLPIDEM TARTRATE 5 MG PO TABS: 5.0000 mg | ORAL_TABLET | Freq: Every evening | ORAL | Status: DC | PRN

## 2016-10-08 MED ORDER — GLIPIZIDE ER 10 MG PO TB24: 10.0000 mg | ORAL_TABLET | Freq: Every day | ORAL | Status: DC

## 2016-10-08 MED ORDER — CHLORHEXIDINE GLUCONATE 4 % EX LIQD: 60.0000 mL | Freq: Once | CUTANEOUS | Status: DC

## 2016-10-08 MED ORDER — PANTOPRAZOLE SODIUM 40 MG PO TBEC
40.0000 mg | DELAYED_RELEASE_TABLET | Freq: Every evening | ORAL | Status: DC
Start: 2016-10-08 — End: 2016-10-11
  Administered 2016-10-08 – 2016-10-10 (×3): 40 mg via ORAL
  Filled 2016-10-08 (×3): qty 1

## 2016-10-08 MED ORDER — LACTATED RINGERS IV SOLN
INTRAVENOUS | Status: DC | PRN
  Administered 2016-10-08 (×2): via INTRAVENOUS

## 2016-10-08 MED ORDER — METFORMIN HCL 500 MG PO TABS: 500.0000 mg | ORAL_TABLET | Freq: Two times a day (BID) | ORAL | Status: DC

## 2016-10-08 MED ORDER — LIDOCAINE 2% (20 MG/ML) 5 ML SYRINGE
INTRAMUSCULAR | Status: DC | PRN
  Administered 2016-10-08: 20 mg via INTRAVENOUS

## 2016-10-08 MED ORDER — METOCLOPRAMIDE HCL 5 MG PO TABS: 5.0000 mg | ORAL_TABLET | Freq: Three times a day (TID) | ORAL | Status: DC | PRN

## 2016-10-08 MED ORDER — OXYCODONE HCL 5 MG PO TABS
5.0000 mg | ORAL_TABLET | ORAL | Status: DC | PRN
  Administered 2016-10-08 – 2016-10-09 (×5): 10 mg via ORAL
  Filled 2016-10-08 (×5): qty 2

## 2016-10-08 MED ORDER — ONDANSETRON HCL 4 MG/2ML IJ SOLN
4.0000 mg | Freq: Four times a day (QID) | INTRAMUSCULAR | Status: DC | PRN
  Administered 2016-10-08: 4 mg via INTRAVENOUS
  Filled 2016-10-08: qty 2

## 2016-10-08 MED ORDER — CEFAZOLIN IN D5W 1 GM/50ML IV SOLN
1.0000 g | Freq: Four times a day (QID) | INTRAVENOUS | Status: AC
  Administered 2016-10-08 – 2016-10-09 (×2): 1 g via INTRAVENOUS
  Filled 2016-10-08 (×2): qty 50

## 2016-10-08 MED ORDER — SODIUM CHLORIDE 0.9 % IR SOLN
Status: DC | PRN
  Administered 2016-10-08: 3000 mL

## 2016-10-08 MED ORDER — LIDOCAINE 2% (20 MG/ML) 5 ML SYRINGE
INTRAMUSCULAR | Status: AC
  Filled 2016-10-08: qty 5

## 2016-10-08 MED ORDER — AMLODIPINE BESYLATE 5 MG PO TABS
5.0000 mg | ORAL_TABLET | Freq: Every day | ORAL | Status: DC
  Administered 2016-10-08 – 2016-10-11 (×4): 5 mg via ORAL
  Filled 2016-10-08 (×4): qty 1

## 2016-10-08 MED ORDER — FENTANYL CITRATE (PF) 100 MCG/2ML IJ SOLN
INTRAMUSCULAR | Status: AC
  Filled 2016-10-08: qty 2

## 2016-10-08 MED ORDER — ASPIRIN 81 MG PO CHEW
81.0000 mg | CHEWABLE_TABLET | Freq: Two times a day (BID) | ORAL | Status: DC
  Administered 2016-10-08 – 2016-10-11 (×6): 81 mg via ORAL
  Filled 2016-10-08 (×6): qty 1

## 2016-10-08 MED ORDER — ALUM & MAG HYDROXIDE-SIMETH 200-200-20 MG/5ML PO SUSP: 30.0000 mL | ORAL | Status: DC | PRN

## 2016-10-08 MED ORDER — PROMETHAZINE HCL 25 MG/ML IJ SOLN: 6.2500 mg | INTRAMUSCULAR | Status: DC | PRN

## 2016-10-08 MED ORDER — VITAMIN D 1000 UNITS PO TABS
1000.0000 [IU] | ORAL_TABLET | Freq: Every evening | ORAL | Status: DC
  Administered 2016-10-08 – 2016-10-10 (×3): 1000 [IU] via ORAL
  Filled 2016-10-08 (×7): qty 1

## 2016-10-08 MED ORDER — ACETAMINOPHEN 650 MG RE SUPP: 650.0000 mg | Freq: Four times a day (QID) | RECTAL | Status: DC | PRN

## 2016-10-08 MED ORDER — BUPIVACAINE-EPINEPHRINE (PF) 0.5% -1:200000 IJ SOLN: INTRAMUSCULAR | Status: DC | PRN

## 2016-10-08 MED ORDER — HYDROMORPHONE HCL 1 MG/ML IJ SOLN: 0.2500 mg | INTRAMUSCULAR | Status: DC | PRN

## 2016-10-08 MED ORDER — MENTHOL 3 MG MT LOZG: 1.0000 | LOZENGE | OROMUCOSAL | Status: DC | PRN

## 2016-10-08 MED ORDER — METHOCARBAMOL 500 MG PO TABS
500.0000 mg | ORAL_TABLET | Freq: Four times a day (QID) | ORAL | Status: DC | PRN
  Administered 2016-10-08 – 2016-10-09 (×2): 500 mg via ORAL
  Filled 2016-10-08 (×2): qty 1

## 2016-10-08 MED ORDER — PHENOL 1.4 % MT LIQD: 1.0000 | OROMUCOSAL | Status: DC | PRN

## 2016-10-08 MED ORDER — EPHEDRINE SULFATE-NACL 50-0.9 MG/10ML-% IV SOSY
PREFILLED_SYRINGE | INTRAVENOUS | Status: DC | PRN
  Administered 2016-10-08: 5 mg via INTRAVENOUS

## 2016-10-08 MED ORDER — LACTATED RINGERS IV SOLN
INTRAVENOUS | Status: DC
  Administered 2016-10-08: 11:00:00 via INTRAVENOUS

## 2016-10-08 MED ORDER — PHENYLEPHRINE HCL 10 MG/ML IJ SOLN
INTRAVENOUS | Status: DC | PRN
  Administered 2016-10-08: 20 ug/min via INTRAVENOUS

## 2016-10-08 MED ORDER — PROPOFOL 500 MG/50ML IV EMUL
INTRAVENOUS | Status: DC | PRN
  Administered 2016-10-08: 50 ug/kg/min via INTRAVENOUS

## 2016-10-08 MED ORDER — 0.9 % SODIUM CHLORIDE (POUR BTL) OPTIME
TOPICAL | Status: DC | PRN
  Administered 2016-10-08: 1000 mL

## 2016-10-08 MED ORDER — MEPERIDINE HCL 25 MG/ML IJ SOLN: 6.2500 mg | INTRAMUSCULAR | Status: DC | PRN

## 2016-10-08 MED ORDER — PRAVASTATIN SODIUM 40 MG PO TABS
40.0000 mg | ORAL_TABLET | Freq: Every day | ORAL | Status: DC
  Administered 2016-10-08 – 2016-10-10 (×3): 40 mg via ORAL
  Filled 2016-10-08 (×3): qty 1

## 2016-10-08 MED ORDER — DOCUSATE SODIUM 100 MG PO CAPS
100.0000 mg | ORAL_CAPSULE | Freq: Two times a day (BID) | ORAL | Status: DC
  Administered 2016-10-08 – 2016-10-10 (×4): 100 mg via ORAL
  Filled 2016-10-08 (×6): qty 1

## 2016-10-08 MED ORDER — HYDROMORPHONE HCL 2 MG/ML IJ SOLN: 1.0000 mg | INTRAMUSCULAR | Status: DC | PRN

## 2016-10-08 SURGICAL SUPPLY — 53 items
BENZOIN TINCTURE PRP APPL 2/3 (GAUZE/BANDAGES/DRESSINGS) ×3 IMPLANT
BLADE SAW SGTL 18X1.27X75 (BLADE) ×2 IMPLANT
BLADE SAW SGTL 18X1.27X75MM (BLADE) ×1
BLADE SURG ROTATE 9660 (MISCELLANEOUS) IMPLANT
CAPT HIP TOTAL 2 ×3 IMPLANT
CELLS DAT CNTRL 66122 CELL SVR (MISCELLANEOUS) ×1 IMPLANT
CLOSURE WOUND 1/2 X4 (GAUZE/BANDAGES/DRESSINGS) ×1
COVER SURGICAL LIGHT HANDLE (MISCELLANEOUS) ×3 IMPLANT
CUP ACET PNNCL SECTR W/GRIP 56 (Hips) ×1 IMPLANT
DRAPE C-ARM 42X72 X-RAY (DRAPES) ×3 IMPLANT
DRAPE STERI IOBAN 125X83 (DRAPES) ×3 IMPLANT
DRAPE U-SHAPE 47X51 STRL (DRAPES) ×9 IMPLANT
DRSG AQUACEL AG ADV 3.5X10 (GAUZE/BANDAGES/DRESSINGS) ×3 IMPLANT
DURAPREP 26ML APPLICATOR (WOUND CARE) ×3 IMPLANT
ELECT BLADE 4.0 EZ CLEAN MEGAD (MISCELLANEOUS) ×3
ELECT BLADE 6.5 EXT (BLADE) IMPLANT
ELECT REM PT RETURN 9FT ADLT (ELECTROSURGICAL) ×3
ELECTRODE BLDE 4.0 EZ CLN MEGD (MISCELLANEOUS) ×1 IMPLANT
ELECTRODE REM PT RTRN 9FT ADLT (ELECTROSURGICAL) ×1 IMPLANT
FACESHIELD WRAPAROUND (MASK) ×9 IMPLANT
GLOVE BIOGEL PI IND STRL 8 (GLOVE) ×2 IMPLANT
GLOVE BIOGEL PI INDICATOR 8 (GLOVE) ×4
GLOVE ECLIPSE 8.0 STRL XLNG CF (GLOVE) ×3 IMPLANT
GLOVE ORTHO TXT STRL SZ7.5 (GLOVE) ×6 IMPLANT
GOWN STRL REUS W/ TWL LRG LVL3 (GOWN DISPOSABLE) ×2 IMPLANT
GOWN STRL REUS W/ TWL XL LVL3 (GOWN DISPOSABLE) ×2 IMPLANT
GOWN STRL REUS W/TWL LRG LVL3 (GOWN DISPOSABLE) ×4
GOWN STRL REUS W/TWL XL LVL3 (GOWN DISPOSABLE) ×4
HANDPIECE INTERPULSE COAX TIP (DISPOSABLE) ×2
KIT BASIN OR (CUSTOM PROCEDURE TRAY) ×3 IMPLANT
KIT ROOM TURNOVER OR (KITS) ×3 IMPLANT
MANIFOLD NEPTUNE II (INSTRUMENTS) ×3 IMPLANT
NS IRRIG 1000ML POUR BTL (IV SOLUTION) ×3 IMPLANT
PACK TOTAL JOINT (CUSTOM PROCEDURE TRAY) ×3 IMPLANT
PAD ARMBOARD 7.5X6 YLW CONV (MISCELLANEOUS) ×6 IMPLANT
PINN SECTOR W/GRIP ACE CUP 56 (Hips) ×3 IMPLANT
RTRCTR WOUND ALEXIS 18CM MED (MISCELLANEOUS) ×3
SET HNDPC FAN SPRY TIP SCT (DISPOSABLE) ×1 IMPLANT
STAPLER VISISTAT 35W (STAPLE) IMPLANT
STRIP CLOSURE SKIN 1/2X4 (GAUZE/BANDAGES/DRESSINGS) ×2 IMPLANT
SUT ETHIBOND NAB CT1 #1 30IN (SUTURE) ×3 IMPLANT
SUT MNCRL AB 4-0 PS2 18 (SUTURE) ×3 IMPLANT
SUT VIC AB 0 CT1 27 (SUTURE) ×2
SUT VIC AB 0 CT1 27XBRD ANBCTR (SUTURE) ×1 IMPLANT
SUT VIC AB 1 CT1 27 (SUTURE) ×2
SUT VIC AB 1 CT1 27XBRD ANBCTR (SUTURE) ×1 IMPLANT
SUT VIC AB 2-0 CT1 27 (SUTURE) ×2
SUT VIC AB 2-0 CT1 TAPERPNT 27 (SUTURE) ×1 IMPLANT
TOWEL OR 17X24 6PK STRL BLUE (TOWEL DISPOSABLE) ×3 IMPLANT
TOWEL OR 17X26 10 PK STRL BLUE (TOWEL DISPOSABLE) ×3 IMPLANT
TRAY CATH 16FR W/PLASTIC CATH (SET/KITS/TRAYS/PACK) ×3 IMPLANT
TRAY FOLEY CATH 16FRSI W/METER (SET/KITS/TRAYS/PACK) IMPLANT
WATER STERILE IRR 1000ML POUR (IV SOLUTION) ×3 IMPLANT

## 2016-10-08 NOTE — Transfer of Care (Signed)
Immediate Anesthesia Transfer of Care Note  Patient: Alex Carlson  Procedure(s) Performed: Procedure(s): RIGHT TOTAL HIP ARTHROPLASTY ANTERIOR APPROACH (Right)  Patient Location: PACU  Anesthesia Type:Spinal  Level of Consciousness: awake, alert , oriented and patient cooperative  Airway & Oxygen Therapy: Patient Spontanous Breathing and Patient connected to nasal cannula oxygen  Post-op Assessment: Report given to RN and Post -op Vital signs reviewed and stable  Post vital signs: Reviewed and stable  Last Vitals:  Vitals:   10/08/16 1118  BP: 135/84  Pulse: 81  Resp: 18  Temp: 36.8 C    Last Pain:  Vitals:   10/08/16 1118  TempSrc: Oral      Patients Stated Pain Goal: 4 ([id number])  Complications: No apparent anesthesia complications

## 2016-10-08 NOTE — Anesthesia Procedure Notes (Signed)
Procedure Name: MAC Date/Time: 10/08/2016 1:36 PM Performed by: Everlean Cherry A Pre-anesthesia Checklist: Patient identified, Emergency Drugs available, Suction available and Patient being monitored Patient Re-evaluated:Patient Re-evaluated prior to inductionOxygen Delivery Method: Nasal cannula

## 2016-10-08 NOTE — Anesthesia Preprocedure Evaluation (Signed)
Anesthesia Evaluation  Patient identified by MRN, date of birth, ID band Patient awake    Reviewed: Allergy & Precautions, NPO status , Patient's Chart, lab work & pertinent test results  Airway Mallampati: II  TM Distance: >3 FB Neck ROM: Full    Dental no notable dental hx.    Pulmonary neg pulmonary ROS,    Pulmonary exam normal breath sounds clear to auscultation       Cardiovascular hypertension, Pt. on medications Normal cardiovascular exam Rhythm:Regular Rate:Normal     Neuro/Psych negative neurological ROS  negative psych ROS   GI/Hepatic negative GI ROS, Neg liver ROS,   Endo/Other  diabetes, Type 2  Renal/GU negative Renal ROS  negative genitourinary   Musculoskeletal negative musculoskeletal ROS (+)   Abdominal   Peds negative pediatric ROS (+)  Hematology negative hematology ROS (+)   Anesthesia Other Findings   Reproductive/Obstetrics negative OB ROS                             Anesthesia Physical Anesthesia Plan  ASA: II  Anesthesia Plan: Spinal   Post-op Pain Management:    Induction: Intravenous  Airway Management Planned:   Additional Equipment:   Intra-op Plan:   Post-operative Plan:   Informed Consent: I have reviewed the patients History and Physical, chart, labs and discussed the procedure including the risks, benefits and alternatives for the proposed anesthesia with the patient or authorized representative who has indicated his/her understanding and acceptance.   Dental advisory given  Plan Discussed with: CRNA  Anesthesia Plan Comments:         Anesthesia Quick Evaluation

## 2016-10-08 NOTE — H&P (Signed)
TOTAL HIP ADMISSION H&P  Patient is admitted for right total hip arthroplasty.  Subjective:  Chief Complaint: right hip pain  HPI: Alex Carlson, 76 y.o. male, has a history of pain and functional disability in the right hip(s) due to arthritis and patient has failed non-surgical conservative treatments for greater than 12 weeks to include NSAID's and/or analgesics, corticosteriod injections, use of assistive devices and activity modification.  Onset of symptoms was gradual starting 3 years ago with gradually worsening course since that time.The patient noted no past surgery on the right hip(s).  Patient currently rates pain in the right hip at 10 out of 10 with activity. Patient has night pain, worsening of pain with activity and weight bearing, trendelenberg gait, pain that interfers with activities of daily living and pain with passive range of motion. Patient has evidence of subchondral cysts, subchondral sclerosis, periarticular osteophytes and joint space narrowing by imaging studies. This condition presents safety issues increasing the risk of falls.  There is no current active infection.  Patient Active Problem List   Diagnosis Date Noted  . Unilateral primary osteoarthritis, right hip 10/08/2016   Past Medical History:  Diagnosis Date  . Arthritis   . Cancer [facility])    family  . Diabetes mellitus without complication ([place])    family  . Hypertension     Past Surgical History:  Procedure Laterality Date  . COLONOSCOPY      Prescriptions Prior to Admission  Medication Sig Dispense Refill Last Dose  . acetaminophen (TYLENOL ARTHRITIS PAIN) 650 MG CR tablet Take 1,300 mg by mouth every 8 (eight) hours as needed for pain.   Past Week at Unknown time  . amLODipine (NORVASC) 5 MG tablet Take 5 mg by mouth daily.    10/07/2016 at Unknown time  . aspirin 81 MG tablet Take 81 mg by mouth every evening.    Past Week at Unknown time  . Calcium Carbonate-Vit D-Min (CALCIUM 1200 PO) Take  1,200 mg by mouth 2 (two) times daily.   Past Week at Unknown time  . Cholecalciferol (VITAMIN D3) 1000 units CAPS Take 1,000 Units by mouth every evening.   Past Week at Unknown time  . GLIPIZIDE XL 10 MG 24 hr tablet Take 10 mg by mouth daily with breakfast.    10/07/2016 at Unknown time  . lovastatin (MEVACOR) 40 MG tablet Take 40 mg by mouth daily.    10/07/2016 at Unknown time  . metFORMIN (GLUCOPHAGE) 1000 MG tablet Take 500-1,000 mg by mouth 2 (two) times daily with a meal. 500 mg in the morning and  1000 mg in the evening   10/07/2016 at Unknown time  . Omega-3 Fatty Acids ([employer] FISH OIL) 1200 MG CAPS Take 1,200 mg by mouth 2 (two) times daily.   Past Week at Unknown time  . pantoprazole (PROTONIX) 40 MG tablet Take 40 mg by mouth every evening.    10/07/2016 at Unknown time   Allergies  Allergen Reactions  . Lisinopril Swelling    SWELLING OF LIPS    Social History  Substance Use Topics  . Smoking status: Never Smoker  . Smokeless tobacco: Never Used  . Alcohol use Yes     Comment: liquor once a week    History reviewed. No pertinent family history.   Review of Systems  Musculoskeletal: Positive for joint pain.  All other systems reviewed and are negative.   Objective:  Physical Exam  Constitutional: He is oriented to person, place, and time. He appears  well-developed and well-nourished.  HENT:  Head: Normocephalic and atraumatic.  Eyes: EOM are normal. Pupils are equal, round, and reactive to light.  Neck: Normal range of motion. Neck supple.  Cardiovascular: Normal rate and regular rhythm.   Respiratory: Effort normal and breath sounds normal.  GI: Soft. Bowel sounds are normal.  Musculoskeletal:       Right hip: He exhibits decreased range of motion, decreased strength, tenderness and bony tenderness.  Neurological: He is alert and oriented to person, place, and time.  Skin: Skin is warm and dry.  Psychiatric: He has a normal mood and affect.    Vital signs in last  24 hours: Temp:  [98.3 F (36.8 C)] 98.3 F (36.8 C) (02/20 1118) Pulse Rate:  [81] 81 (02/20 1118) Resp:  [18] 18 (02/20 1118) BP: (135)/(84) 135/84 (02/20 1118) SpO2:  [97 %] 97 % (02/20 1118)  Labs:   Estimated body mass index is 28.03 kg/m as calculated from the following:   Height as of 09/30/16: 5\' 9"  (1.753 m).   Weight as of 09/30/16: 189 lb 12.8 oz (86.1 kg).   Imaging Review Plain radiographs demonstrate severe degenerative joint disease of the right hip(s). The bone quality appears to be good for age and reported activity level.  Assessment/Plan:  End stage arthritis, right hip(s)  The patient history, physical examination, clinical judgement of the provider and imaging studies are consistent with end stage degenerative joint disease of the right hip(s) and total hip arthroplasty is deemed medically necessary. The treatment options including medical management, injection therapy, arthroscopy and arthroplasty were discussed at length. The risks and benefits of total hip arthroplasty were presented and reviewed. The risks due to aseptic loosening, infection, stiffness, dislocation/subluxation,  thromboembolic complications and other imponderables were discussed.  The patient acknowledged the explanation, agreed to proceed with the plan and consent was signed. Patient is being admitted for inpatient treatment for surgery, pain control, PT, OT, prophylactic antibiotics, VTE prophylaxis, progressive ambulation and ADL's and discharge planning.The patient is planning to be discharged home with home health services

## 2016-10-08 NOTE — Brief Op Note (Signed)
10/08/2016  2:56 PM  PATIENT:  Alex Carlson  76 y.o. male  PRE-OPERATIVE DIAGNOSIS:  osteoarthritis right hip  POST-OPERATIVE DIAGNOSIS:  osteoarthritis right hip  PROCEDURE:  Procedure(s): RIGHT TOTAL HIP ARTHROPLASTY ANTERIOR APPROACH (Right)  SURGEON:  Surgeon(s) and Role:    * Mcarthur Rossetti, MD - Primary  PHYSICIAN ASSISTANT: Benita Stabile, PA-C  ANESTHESIA:   spinal  EBL:  Total I/O In: -  Out: 300 [Blood:300]  COUNTS:  YES  DICTATION: .Other Dictation: Dictation Number [id number]  PLAN OF CARE: Admit to inpatient   PATIENT DISPOSITION:  PACU - hemodynamically stable.   Delay start of Pharmacological VTE agent (>24hrs) due to surgical blood loss or risk of bleeding: no

## 2016-10-09 ENCOUNTER — Encounter (HOSPITAL_COMMUNITY): Payer: Self-pay | Admitting: Orthopaedic Surgery

## 2016-10-09 LAB — BASIC METABOLIC PANEL
ANION GAP: 8 (ref 5–15)
BUN: 10 mg/dL (ref 6–20)
CALCIUM: 8.8 mg/dL — AB (ref 8.9–10.3)
CO2: 23 mmol/L (ref 22–32)
CREATININE: 0.84 mg/dL (ref 0.61–1.24)
Chloride: 102 mmol/L (ref 101–111)
Glucose, Bld: 197 mg/dL — ABNORMAL HIGH (ref 65–99)
Potassium: 4 mmol/L (ref 3.5–5.1)
Sodium: 133 mmol/L — ABNORMAL LOW (ref 135–145)

## 2016-10-09 LAB — GLUCOSE, CAPILLARY
GLUCOSE-CAPILLARY: 180 mg/dL — AB (ref 65–99)
GLUCOSE-CAPILLARY: 182 mg/dL — AB (ref 65–99)
Glucose-Capillary: 247 mg/dL — ABNORMAL HIGH (ref 65–99)
Glucose-Capillary: 189 mg/dL — ABNORMAL HIGH (ref 65–99)

## 2016-10-09 LAB — CBC
HEMATOCRIT: 37.8 % — AB (ref 39.0–52.0)
Hemoglobin: 12.4 g/dL — ABNORMAL LOW (ref 13.0–17.0)
MCH: 29.2 pg (ref 26.0–34.0)
MCHC: 32.8 g/dL (ref 30.0–36.0)
MCV: 89.2 fL (ref 78.0–100.0)
PLATELETS: 186 10*3/uL (ref 150–400)
RBC: 4.24 MIL/uL (ref 4.22–5.81)
RDW: 13.8 % (ref 11.5–15.5)
WBC: 7.3 10*3/uL (ref 4.0–10.5)

## 2016-10-09 NOTE — Progress Notes (Signed)
10/09/16 1300  PT Visit Information  Last PT Received On 10/09/16  Assistance Needed +1  History of Present Illness Alex Carlson, 76 y.o. male, has a history of pain and functional disability in the right hip(s) due to arthritis and patient has failed non-surgical conservative treatments.  Admit for right THA.  Hx of HTN.   Subjective Data  Patient Stated Goal to get better  Precautions  Precautions Fall  Restrictions  Weight Bearing Restrictions Yes  RLE Weight Bearing WBAT  Pain Assessment  Pain Assessment Faces  Faces Pain Scale 2  Pain Location right hip  Pain Descriptors / Indicators Sore  Pain Intervention(s) Limited activity within patient's tolerance;Monitored during session;Repositioned  Cognition  Arousal/Alertness Awake/alert  Behavior During Therapy WFL for tasks assessed/performed  Overall Cognitive Status Within Functional Limits for tasks assessed  Bed Mobility  General bed mobility comments Pt in chair.  Transfers  Overall transfer level Needs assistance  Equipment used Rolling walker (2 wheeled)  Transfers Sit to/from Stand  Sit to Stand Min guard;Min assist  General transfer comment Needed cues for hand placement and incr time to come to standing and to get balance. Posterior LOB with standing needing steadying assist upon standing and with exercisies in standing.   Ambulation/Gait  Ambulation/Gait assistance Min guard  Ambulation Distance (Feet) 70 Feet  Assistive device Rolling walker (2 wheeled)  Gait Pattern/deviations Step-to pattern;Decreased step length - right;Decreased stance time - right;Decreased weight shift to right;Decreased stride length;Antalgic;Trunk flexed  General Gait Details Pt needed cues for sequencing steps and RW.  Pt also needed cues for upright posture.  Pt able to ambulate but would incr hip and knee flexion of right LE with each step but not as pronounced as he did this am.    Gait velocity interpretation Below normal speed for  age/gender  Balance  Overall balance assessment Needs assistance;History of Falls  Sitting-balance support No upper extremity supported;Feet supported  Sitting balance-Leahy Scale Fair  Standing balance support Bilateral upper extremity supported;During functional activity  Standing balance-Leahy Scale Poor  Standing balance comment relies on UE support.    General Comments  General comments (skin integrity, edema, etc.) Wife present and educated in exercises and how to guard pt, transfers and ambulation.  Wife to purchase gait belt to a ssist pt with mobility.    Exercises  Exercises Total Joint  Total Joint Exercises  Ankle Circles/Pumps AROM;Both;10 reps;Supine  Quad Sets AROM;Both;10 reps;Supine  Heel Slides AAROM;Right;10 reps;Supine  Hip ABduction/ADduction AAROM;Right;10 reps;Supine;Standing  Long Arc Quad AROM;Right;10 reps;Seated  Knee Flexion AROM;Right;Standing;10 reps  Marching in Standing AROM;Right;Standing;10 reps  Standing Hip Extension AROM;Right;Standing;10 reps  PT - End of Session  Equipment Utilized During Treatment Gait belt  Activity Tolerance Patient limited by fatigue;Patient limited by pain  Patient left in chair;with call bell/phone within reach;with family/visitor present  Nurse Communication Mobility status  PT - Assessment/Plan  PT Plan Current plan remains appropriate  PT Visit Diagnosis Pain;Unsteadiness on feet (R26.81);Other abnormalities of gait and mobility (R26.89);Difficulty in walking, not elsewhere classified (R26.2)  Pain - Right/Left Right  Pain - part of body Hip  PT Frequency (ACUTE ONLY) 7X/week  Follow Up Recommendations Home health PT;Supervision/Assistance - 24 hour  PT equipment Rolling walker with 5" wheels;3in1 (PT) (wife to purchase gait belt from gift shop)  AM-PAC PT "6 Clicks" Daily Activity Outcome Measure  Difficulty turning over in bed (including adjusting bedclothes, sheets and blankets)? 4  Difficulty moving from lying  on back to sitting on  the side of the bed?  3  Difficulty sitting down on and standing up from a chair with arms (e.g., wheelchair, bedside commode, etc,.)? 3  Help needed moving to and from a bed to chair (including a wheelchair)? 3  Help needed walking in hospital room? 3  Help needed climbing 3-5 steps with a railing?  2  6 Click Score 18  Mobility G Code  CK  PT Goal Progression  Progress towards PT goals Progressing toward goals  PT Time Calculation  PT Start Time (ACUTE ONLY) 1143  PT Stop Time (ACUTE ONLY) 1209  PT Time Calculation (min) (ACUTE ONLY) 26 min  PT General Charges  $$ ACUTE PT VISIT 1 Procedure  PT Treatments  $Gait Training 8-22 mins  $Therapeutic Exercise 8-22 mins  Pt continues to progress with ambulation and exercises.  Wife educated and assisted with some of treatment to incr her comfort. Continue PT tomorrow to progress as pt tolerates.   [place] [phone] (pager)

## 2016-10-09 NOTE — Op Note (Signed)
Alex Carlson, Alex Carlson NO.:  [id number]  MEDICAL RECORD NO.:  [id number]  LOCATION:                                 FACILITY:  PHYSICIAN:  Lind Guest. Ninfa Linden, M.D.DATE OF BIRTH:  May 22, 1941  DATE OF PROCEDURE:  10/08/2016 DATE OF DISCHARGE:                              OPERATIVE REPORT   POSTOPERATIVE DIAGNOSIS:  Primary osteoarthritis and degenerative joint disease, right hip.  POSTOPERATIVE DIAGNOSIS:  Primary osteoarthritis and degenerative joint disease, right hip.  PROCEDURE:  Right total hip arthroplasty through direct anterior approach.  IMPLANTS:  DePuy Sector Gription acetabular component size 58 and a single screw, size 36+ 0 polyethylene liner, size 14 Corail femoral component with standard offset, size 36+ 8.5 ceramic hip ball.  ASSISTANT:  Lind Guest. Ninfa Linden, M.D.  ASSISTING:  Erskine Emery, PA-C.  ANESTHESIA:  Spinal.  ANTIBIOTICS:  2 g of IV Ancef.  BLOOD LOSS:  Less than 500 mL.  COMPLICATIONS:  None.  INDICATIONS:  [name] is a 76 year old gentleman with debilitating arthritis involving his right hip.  He has tried and failed all forms of conservative treatment.  His x-ray showed complete loss of his joint space on his right hip.  There were sclerotic and cystic changes well as periarticular osteophytes.  He does wish at this point to proceed with a total hip arthroplasty.  His pain is daily and it is 10/10.  It has detrimentally affected his activities of daily living, his quality of life, his mobility.  He understands the risk of acute blood loss anemia, nerve and vessel injury, fracture, infection, dislocation, DVT.  He understands our goals are decreased pain, improved mobility, and overall improved quality of life.  PROCEDURE DESCRIPTION:  After informed consent was obtained, appropriate right hip was marked.  He was brought to the operating room, where spinal anesthesia was obtained while he was on the  stretcher.  He was then laid supine on the stretcher and traction boots were placed on both his feet.  Next, he was placed supine on the Hana fracture table with the perineal post in place and both legs in inline skeletal traction devices, but no traction applied.  His right operative hip was prepped and draped with DuraPrep and sterile drapes.  Time-out was called and she was identified as correct patient and correct right hip.  I then made an incision just inferior and posterior to the anterior superior iliac spine and carried this obliquely down the leg.  We dissected down tensor fascia lata muscle.  The tensor fascia was then divided longitudinally to proceed with a direct anterior approach to the hip. We identified and cauterized the circumflex vessels and then identified the hip capsule.  I opened up the hip capsule in L-type format finding significant periarticular osteophytes around the whole femoral neck and head.  We placed Cobra retractors around the medial and lateral femoral neck within joint capsule and then made our femoral neck cut with an oscillating saw and completed this on osteotome.  We placed a corkscrew guide in the femoral head removed the femoral head.  Entirety and found to be completely devoid of cartilage.  We then placed a bent Hohmann over the  medial acetabular rim and removed remnants of the acetabular labrum and other debris.  I then began reaming under direct visualization from a size 42 reamer all the way up to a size 56.  The 56 was also placed under direct fluoroscopy, so I could obtain my depth of reaming, inclination, and anteversion.  Once I was done with this, I placed the DePuy Sector Gription acetabular component size 56, but it just did not feel comfortable with how it seated and I did go up one size on the reamer to 57.  With that being said, I then put in a size 58 acetabular component.  That did have a good nice tight fit and grab.  I was able  to put it in under direct visualization and fluoroscopy and obtain my depth of placement, my inclination, and anteversion.  I still placed a single screw.  I then placed a 36+ 0 neutral polyethylene liner for a size 58 acetabular component.  Attention was then turned to the femur.  With the leg externally rotated to 120 degrees, extended and adducted, I was able to place a Mueller retractor medially and a Hohmann retractor behind the greater trochanter.  I released the lateral joint capsule and used a box cutting osteotome from the femoral canal and a rongeur lateralize.  I then began broaching from a size 8 broach using the Corail broaching system from DePuy up to a size 14.  With the 14 placed, we trialed a standard offset femoral neck and a 36+ 1 hip ball. We brought the leg back over and up with traction and rotation reducing the pelvis.  I was pleased with stability of the hip, but he definitely needed more offset and leg length.  This was also based on his anatomy. Starting off the case with starting off definitely shorter in real life. We then dislocated the hip and removed the trial components.  I was able to place the real size 14 Corail femoral component with standard offset, with a 36+ 8.5 ceramic hip ball reduced this in acetabulum.  We were pleased with leg length, offset, and range of motion.  We were pleased with stability as well.  We then irrigated the soft tissue with normal saline solution using pulsatile lavage.  We were able to close the joint capsule with interrupted #1 Ethibond suture, followed by running #1 Vicryl in the tensor fascia, 0 Vicryl to the deep tissue, 2-0 Vicryl to the subcutaneous tissue, and interrupted 4-0 Monocryl subcuticular stitch.  Steri-Strips were applied as well as an Aquacel dressing.  He was taken off the Hana table and taken to the recovery room in stable condition.  All final counts were correct.  There were no complications noted.  Of  note, Erskine Emery, PA-C assisted in the entire case.  His assistance was crucial for facilitating all aspects of this case.     Lind Guest. Ninfa Linden, M.D.   ______________________________ Lind Guest. Ninfa Linden, M.D.    CYB/MEDQ  D:  10/08/2016  T:  10/08/2016  Job:  [id number]

## 2016-10-09 NOTE — Evaluation (Signed)
Occupational Therapy Evaluation Patient Details Name: Alex Carlson MRN: [id number] DOB: [birth year] Today's Date: 10/09/2016    History of Present Illness Alex Carlson, 76 y.o. male, has a history of pain and functional disability in the right hip(s) due to arthritis and patient has failed non-surgical conservative treatments.  Admit for right THA.  Hx of HTN.    Clinical Impression   Pt needs min to mod assist for functional transfers and sit to stand from handicapped height toilet.  He needed min assist for LB selfcare with use of AE secondary to decreased flexibility and increased pain with attempted bending down toward his feet.  Feel he will benefit from acute care OT to progress to supervision level for discharge home.      Follow Up Recommendations  Home health OT    Equipment Recommendations  3 in 1 bedside commode       Precautions / Restrictions Precautions Precautions: Fall Restrictions Weight Bearing Restrictions: No RLE Weight Bearing: Weight bearing as tolerated      Mobility Bed Mobility Overal bed mobility: Needs Assistance Bed Mobility: Supine to Sit;Sit to Supine     Supine to sit: Mod assist Sit to supine: Mod assist   General bed mobility comments: Pt needs mod assist for lifting the RLE in the bed and for moving it out to the edge of the bed.    Transfers Overall transfer level: Needs assistance Equipment used: Rolling walker (2 wheeled) Transfers: Sit to/from Stand Sit to Stand: Min assist;Mod assist         General transfer comment: Mod assist for handicapped toilet, min assist from the EOB.      Balance Overall balance assessment: Needs assistance;History of Falls Sitting-balance support: No upper extremity supported;Feet supported Sitting balance-Leahy Scale: Poor Sitting balance - Comments: Pt with LOB posteriorly with initial sitting until he scooted out to the EOB.    Standing balance support: Bilateral upper extremity  supported;During functional activity Standing balance-Leahy Scale: Poor Standing balance comment: He needs UE support on the walker in standing.                             ADL Overall ADL's : Needs assistance/impaired Eating/Feeding: Independent;Bed level   Grooming: Wash/dry hands;Wash/dry face;Minimal assistance;Standing   Upper Body Bathing: Supervision/ safety;Sitting   Lower Body Bathing: Minimal assistance;Sit to/from stand   Upper Body Dressing : Supervision/safety;Sitting   Lower Body Dressing: Minimal assistance;Sit to/from stand;With adaptive equipment   Toilet Transfer: Ambulation;RW;BSC;Moderate assistance   Toileting- Clothing Manipulation and Hygiene: Minimal assistance;Sit to/from stand       Functional mobility during ADLs: Minimal assistance;Rolling walker General ADL Comments: Pt and spouse educated on AE for LB selfcare sit to stand.  Mod demonstrational cueing for use of the sock aide and reacher to doff and donn gripper socks.  He needs min assist for sit to stand from the EOB, but mod assist from lowered commode.  Recommend 3:1 for use at home.      Vision Baseline Vision/History: No visual deficits Patient Visual Report: No change from baseline Vision Assessment?: No apparent visual deficits     Perception Perception Perception Tested?: No   Praxis Praxis Praxis tested?: Not tested    Pertinent Vitals/Pain Pain Assessment: 0-10 Pain Score: 3  Faces Pain Scale: Hurts a little bit Pain Location: right hip Pain Descriptors / Indicators: Discomfort Pain Intervention(s): Limited activity within patient's tolerance;Monitored during session     Hand  Dominance Right   Extremity/Trunk Assessment Upper Extremity Assessment Upper Extremity Assessment: Overall WFL for tasks assessed   Lower Extremity Assessment Lower Extremity Assessment: Defer to PT evaluation   Cervical / Trunk Assessment Cervical / Trunk Assessment: Normal    Communication Communication Communication: No difficulties   Cognition Arousal/Alertness: Awake/alert Behavior During Therapy: WFL for tasks assessed/performed Overall Cognitive Status: Within Functional Limits for tasks assessed                     General Comments  Wife present and educated in exercises and how to guard pt, transfers and ambulation.  Wife to purchase gait belt to a ssist pt with mobility.      Exercises Exercises: Total Joint     Shoulder Instructions      Home Living Family/patient expects to be discharged to:: Private residence Living Arrangements: Spouse/significant other Available Help at Discharge: Family;Available 24 hours/day Type of Home: House Home Access: Level entry     Home Layout: One level     Bathroom Shower/Tub: Occupational psychologist: Handicapped height Bathroom Accessibility: Yes   Home Equipment: Cane - single point;Grab bars - tub/shower;Shower seat - built in          Prior Functioning/Environment Level of Independence: Independent (States he hopped on leg due to pain PTA)        Comments: Pt retired and drove PTA.        OT Problem List: Decreased strength;Impaired balance (sitting and/or standing);Pain;Decreased knowledge of use of DME or AE      OT Treatment/Interventions: Self-care/ADL training;Balance training;Patient/family education;Therapeutic activities;DME and/or AE instruction    OT Goals(Current goals can be found in the care plan section) Acute Rehab OT Goals Patient Stated Goal: To get back to walking better. OT Goal Formulation: With patient/family Time For Goal Achievement: 10/16/16 Potential to Achieve Goals: Good  OT Frequency: Min 2X/week              End of Session Equipment Utilized During Treatment: Surveyor, mining Communication: Mobility status  Activity Tolerance: Patient tolerated treatment well Patient left: in bed;with call bell/phone within reach  OT Visit  Diagnosis: Unsteadiness on feet (R26.81);Muscle weakness (generalized) (M62.81);Pain Pain - Right/Left: Right Pain - part of body: Hip                ADL either performed or assessed with clinical judgement  Time: [id number] OT Time Calculation (min): 36 min Charges:  OT General Charges $OT Visit: 1 Procedure OT Evaluation $OT Eval Moderate Complexity: 1 Procedure OT Treatments $Self Care/Home Management : 23-37 mins   Lena Gores OTR/L 10/09/2016, 4:56 PM

## 2016-10-09 NOTE — Progress Notes (Signed)
Subjective: 1 Day Post-Op Procedure(s) (LRB): RIGHT TOTAL HIP ARTHROPLASTY ANTERIOR APPROACH (Right) Patient reports pain as moderate.    Objective: Vital signs in last 24 hours: Temp:  [97.5 F (36.4 C)-100.4 F (38 C)] 98.5 F (36.9 C) (02/21 0619) Pulse Rate:  [62-95] 93 (02/21 0619) Resp:  [12-20] 18 (02/21 0619) BP: (95-150)/(67-87) 149/78 (02/21 0619) SpO2:  [94 %-100 %] 95 % (02/21 0619)  Intake/Output from previous day: 02/20 0701 - 02/21 0700 In: 1590 [P.O.:100; I.V.:1380; IV Piggyback:110] Out: 1550 [Urine:900; Emesis/NG output:300; Blood:350] Intake/Output this shift: No intake/output data recorded.   Recent Labs  10/09/16 0436  HGB 12.4*    Recent Labs  10/09/16 0436  WBC 7.3  RBC 4.24  HCT 37.8*  PLT 186    Recent Labs  10/09/16 0436  NA 133*  K 4.0  CL 102  CO2 23  BUN 10  CREATININE 0.84  GLUCOSE 197*  CALCIUM 8.8*   No results for input(s): LABPT, INR in the last 72 hours.  Sensation intact distally Intact pulses distally Dorsiflexion/Plantar flexion intact Incision: dressing C/D/I  Assessment/Plan: 1 Day Post-Op Procedure(s) (LRB): RIGHT TOTAL HIP ARTHROPLASTY ANTERIOR APPROACH (Right) Up with therapy  Alex Carlson 10/09/2016, 7:43 AM

## 2016-10-09 NOTE — Anesthesia Postprocedure Evaluation (Signed)
Anesthesia Post Note  Patient: Chares Pavelko  Procedure(s) Performed: Procedure(s) (LRB): RIGHT TOTAL HIP ARTHROPLASTY ANTERIOR APPROACH (Right)  Patient location during evaluation: PACU Anesthesia Type: Spinal Level of consciousness: awake and alert Pain management: pain level controlled Vital Signs Assessment: post-procedure vital signs reviewed and stable Respiratory status: spontaneous breathing and respiratory function stable Cardiovascular status: blood pressure returned to baseline and stable Postop Assessment: spinal receding Anesthetic complications: no       Last Vitals:  Vitals:   10/09/16 0619 10/09/16 0900  BP: (!) 149/78 (!) 155/75  Pulse: 93 99  Resp: 18 18  Temp: 36.9 C 37.7 C    Last Pain:  Vitals:   10/09/16 0900  TempSrc: Oral  PainSc:     LLE Motor Response: Purposeful movement;Responds to commands (just a little bit) (10/09/16 0900) LLE Sensation: Full sensation (10/09/16 0900) RLE Motor Response: Purposeful movement;Responds to commands (a little bit) (10/09/16 0900) RLE Sensation: Full sensation (10/09/16 0900) L Sensory Level: S1-Sole of foot, small toes (10/09/16 0900) R Sensory Level: S1-Sole of foot, small toes (10/09/16 0900)  Nolon Nations

## 2016-10-09 NOTE — Evaluation (Signed)
Physical Therapy Evaluation Patient Details Name: Alex Carlson MRN: [id number] DOB: 1941-08-05 Today's Date: 10/09/2016   History of Present Illness  Alex Carlson, 76 y.o. male, has a history of pain and functional disability in the right hip(s) due to arthritis and patient has failed non-surgical conservative treatments.  Admit for right THA.  Hx of HTN.   Clinical Impression  Pt admitted with above diagnosis. Pt currently with functional limitations due to the deficits listed below (see PT Problem List). Pt was able to ambulate in hallway a short distance.  Should progress well. Supportive wife. Will follow acutely.   Pt will benefit from skilled PT to increase their independence and safety with mobility to allow discharge to the venue listed below.      Follow Up Recommendations Home health PT;Supervision/Assistance - 24 hour    Equipment Recommendations  Rolling walker with 5" wheels;3in1 (PT)    Recommendations for Other Services       Precautions / Restrictions Precautions Precautions: Fall Restrictions Weight Bearing Restrictions: Yes RLE Weight Bearing: Weight bearing as tolerated      Mobility  Bed Mobility Overal bed mobility: Needs Assistance Bed Mobility: Supine to Sit     Supine to sit: Min assist     General bed mobility comments: Needed a little helpwith right LE.  Also steadied pts trunk  when trying to get scooted out as pt lost balance posteriorly due to pain.   Transfers Overall transfer level: Needs assistance Equipment used: Rolling walker (2 wheeled) Transfers: Sit to/from Stand Sit to Stand: Min guard;Min assist         General transfer comment: Needed cues for hand placement and incr time to come to standing and to get balance.   Ambulation/Gait Ambulation/Gait assistance: Min guard Ambulation Distance (Feet): 70 Feet Assistive device: Rolling walker (2 wheeled) Gait Pattern/deviations: Step-to pattern;Decreased step length -  right;Decreased stance time - right;Decreased weight shift to right;Decreased stride length;Antalgic;Trunk flexed   Gait velocity interpretation: Below normal speed for age/gender General Gait Details: Pt needed cues for sequencing steps and RW.  Pt also needed cues for upright posture.  Pt able to ambulate but would incr hip and knee flexion of right LE with each step.    Stairs            Wheelchair Mobility    Modified Rankin (Stroke Patients Only)       Balance Overall balance assessment: Needs assistance;History of Falls Sitting-balance support: No upper extremity supported;Feet supported Sitting balance-Leahy Scale: Fair     Standing balance support: Bilateral upper extremity supported;During functional activity Standing balance-Leahy Scale: Poor Standing balance comment: relies on UE support.                               Pertinent Vitals/Pain Pain Assessment: Faces Faces Pain Scale: Hurts little more Pain Location: right hip Pain Descriptors / Indicators: Sore Pain Intervention(s): Limited activity within patient's tolerance;Monitored during session;Repositioned   VSS Home Living Family/patient expects to be discharged to:: Private residence Living Arrangements: Spouse/significant other Available Help at Discharge: Family;Available 24 hours/day Type of Home: House Home Access: Level entry     Home Layout: One level Home Equipment: Cane - single point      Prior Function Level of Independence: Independent (States he hopped on leg due to pain PTA)         Comments: Pt retired and drove PTA.     Hand Dominance  Extremity/Trunk Assessment   Upper Extremity Assessment Upper Extremity Assessment: Defer to OT evaluation    Lower Extremity Assessment Lower Extremity Assessment: Generalized weakness    Cervical / Trunk Assessment Cervical / Trunk Assessment: Normal  Communication   Communication: No difficulties  Cognition  Arousal/Alertness: Awake/alert Behavior During Therapy: WFL for tasks assessed/performed Overall Cognitive Status: Within Functional Limits for tasks assessed                      General Comments      Exercises Total Joint Exercises Ankle Circles/Pumps: AROM;Both;10 reps;Supine Quad Sets: AROM;Both;10 reps;Supine Heel Slides: AAROM;Right;10 reps;Supine Hip ABduction/ADduction: AAROM;Right;10 reps;Supine;Standing Long Arc Quad: AROM;Right;10 reps;Seated Knee Flexion: AROM;Right;5 reps;Standing Marching in Standing: AROM;Right;5 reps;Standing Standing Hip Extension: AROM;Right;5 reps;Standing   Assessment/Plan    PT Assessment Patient needs continued PT services  PT Problem List Decreased strength;Decreased range of motion;Decreased activity tolerance;Decreased balance;Decreased mobility;Decreased knowledge of use of DME;Decreased safety awareness;Pain       PT Treatment Interventions DME instruction;Functional mobility training;Therapeutic activities;Therapeutic exercise;Balance training;Gait training;Patient/family education    PT Goals (Current goals can be found in the Care Plan section)  Acute Rehab PT Goals Patient Stated Goal: to get better PT Goal Formulation: With patient Time For Goal Achievement: 10/16/16 Potential to Achieve Goals: Good    Frequency 7X/week   Barriers to discharge        Co-evaluation               End of Session Equipment Utilized During Treatment: Gait belt Activity Tolerance: Patient limited by fatigue;Patient limited by pain Patient left: in chair;with call bell/phone within reach;with family/visitor present Nurse Communication: Mobility status PT Visit Diagnosis: Pain;Unsteadiness on feet (R26.81);Other abnormalities of gait and mobility (R26.89);Difficulty in walking, not elsewhere classified (R26.2) Pain - Right/Left: Right Pain - part of body: Hip         Time: [id number] PT Time Calculation (min) (ACUTE ONLY):  25 min   Charges:   PT Evaluation $PT Eval Moderate Complexity: 1 Procedure PT Treatments $Gait Training: 8-22 mins   PT G Codes:         Alex Carlson November 06, 2016, 11:18 AM Amanda [facility] Acute Rehabilitation [phone] (pager)

## 2016-10-10 LAB — GLUCOSE, CAPILLARY
GLUCOSE-CAPILLARY: 215 mg/dL — AB (ref 65–99)
Glucose-Capillary: 163 mg/dL — ABNORMAL HIGH (ref 65–99)
Glucose-Capillary: 191 mg/dL — ABNORMAL HIGH (ref 65–99)
Glucose-Capillary: 190 mg/dL — ABNORMAL HIGH (ref 65–99)
Glucose-Capillary: 188 mg/dL — ABNORMAL HIGH (ref 65–99)

## 2016-10-10 NOTE — Progress Notes (Signed)
Occupational Therapy Treatment Patient Details Name: Alex Carlson MRN: [id number] DOB: 1941-02-07 Today's Date: 10/10/2016    History of present illness Alex Carlson, 76 y.o. male, has a history of pain and functional disability in the right hip(s) due to arthritis and patient has failed non-surgical conservative treatments.  Admit for right THA.  Hx of HTN.    OT comments  Pt progressing towards acute OT goals. Focus of session was walk-in shower transfer, toilet transfer, and LB dressing, min A level. Spouse present. D/c plan remains appropriate.   Follow Up Recommendations  Home health OT    Equipment Recommendations  3 in 1 bedside commode    Recommendations for Other Services      Precautions / Restrictions Precautions Precautions: Fall Restrictions RLE Weight Bearing: Weight bearing as tolerated       Mobility Bed Mobility               General bed mobility comments: up in chair  Transfers Overall transfer level: Needs assistance Equipment used: Rolling walker (2 wheeled) Transfers: Sit to/from Stand Sit to Stand: Min assist         General transfer comment: from recliner and toilet. Increased speed with descent into chair.     Balance Overall balance assessment: Needs assistance;History of Falls Sitting-balance support: No upper extremity supported;Feet supported       Standing balance support: Bilateral upper extremity supported;During functional activity Standing balance-Leahy Scale: Poor Standing balance comment: He needs UE support on the walker in standing.                    ADL Overall ADL's : Needs assistance/impaired                         Toilet Transfer: Minimal assistance;RW;Ambulation;Comfort height toilet;Grab bars   Toileting- Clothing Manipulation and Hygiene: Minimal assistance;Sit to/from stand   Tub/ Shower Transfer: Min guard;Minimal assistance;Ambulation;Rolling walker Tub/Shower Transfer Details  (indicate cue type and reason): cues for technique with rw Functional mobility during ADLs: Min guard;Minimal assistance;Rolling walker General ADL Comments: Pt practiced walkin shower transfer in simulated setup. Close min guard, cues for technique with rw. [place] transfer using grab bar/armrest.      Vision                     Perception     Praxis      Cognition   Behavior During Therapy: WFL for tasks assessed/performed Overall Cognitive Status: Within Functional Limits for tasks assessed                         Exercises     Shoulder Instructions       General Comments      Pertinent Vitals/ Pain       Pain Assessment: Faces Faces Pain Scale: Hurts little more Pain Location: right hip Pain Descriptors / Indicators: Discomfort Pain Intervention(s): Limited activity within patient's tolerance;Monitored during session;Repositioned  Home Living                                          Prior Functioning/Environment              Frequency  Min 2X/week        Progress Toward Goals  OT Goals(current goals can now be found in  the care plan section)  Progress towards OT goals: Progressing toward goals  Acute Rehab OT Goals Patient Stated Goal: To get back to walking better. OT Goal Formulation: With patient/family Time For Goal Achievement: 10/16/16 Potential to Achieve Goals: Good ADL Goals Pt Will Perform Grooming: with supervision;standing Pt Will Perform Lower Body Bathing: with supervision;sit to/from stand;with adaptive equipment Pt Will Perform Lower Body Dressing: with supervision;sit to/from stand;with adaptive equipment Pt Will Transfer to Toilet: with supervision;ambulating;bedside commode Pt Will Perform Toileting - Clothing Manipulation and hygiene: with supervision;sit to/from stand Pt Will Perform Tub/Shower Transfer: Shower transfer;with supervision;ambulating;shower seat;rolling walker;grab bars  Plan  Discharge plan remains appropriate    Co-evaluation                 End of Session Equipment Utilized During Treatment: Gait belt;Rolling walker  OT Visit Diagnosis: Unsteadiness on feet (R26.81);Muscle weakness (generalized) (M62.81);Pain Pain - Right/Left: Right Pain - part of body: Hip   Activity Tolerance Patient tolerated treatment well   Patient Left in chair;with call bell/phone within reach;with family/visitor present   Nurse Communication          Time: [id number] OT Time Calculation (min): 26 min  Charges: OT General Charges $OT Visit: 1 Procedure OT Treatments $Self Care/Home Management : 23-37 mins    Hortencia Pilar 10/10/2016, 2:28 PM

## 2016-10-10 NOTE — Progress Notes (Signed)
Subjective: 2 Days Post-Op Procedure(s) (LRB): RIGHT TOTAL HIP ARTHROPLASTY ANTERIOR APPROACH (Right) Patient reports pain as mild.  Reports shaking this morning with double vision. Now resolved feels fine.   Objective: Vital signs in last 24 hours: Temp:  [100 F (37.8 C)-101.7 F (38.7 C)] 100.1 F (37.8 C) (02/22 0426) Pulse Rate:  [102-121] 111 (02/22 0800) Resp:  [16-18] 16 (02/21 2046) BP: (142-152)/(75-86) 152/75 (02/22 0800) SpO2:  [92 %-96 %] 96 % (02/22 0800) Weight:  [191 lb 3.2 oz (86.7 kg)] 191 lb 3.2 oz (86.7 kg) (02/21 1436)  Intake/Output from previous day: 02/21 0701 - 02/22 0700 In: 840 [P.O.:240; I.V.:600] Out: 250 [Urine:250] Intake/Output this shift: No intake/output data recorded.   Recent Labs  10/09/16 0436  HGB 12.4*    Recent Labs  10/09/16 0436  WBC 7.3  RBC 4.24  HCT 37.8*  PLT 186    Recent Labs  10/09/16 0436  NA 133*  K 4.0  CL 102  CO2 23  BUN 10  CREATININE 0.84  GLUCOSE 197*  CALCIUM 8.8*   No results for input(s): LABPT, INR in the last 72 hours.   Right lower extremity: Neurovascular intact Sensation intact distally Intact pulses distally Dorsiflexion/Plantar flexion intact Incision: dressing C/D/I Compartment soft  Assessment/Plan: 2 Days Post-Op Procedure(s) (LRB): RIGHT TOTAL HIP ARTHROPLASTY ANTERIOR APPROACH (Right) Up with therapy  Discharge to home tomorrow.   Alex Carlson 10/10/2016, 12:18 PM

## 2016-10-10 NOTE — Progress Notes (Signed)
Physical Therapy Treatment Patient Details Name: Alex Carlson MRN: [id number] DOB: 1941-04-02 Today's Date: 10/10/2016    History of Present Illness Alex Carlson, 76 y.o. male, has a history of pain and functional disability in the right hip(s) due to arthritis and patient has failed non-surgical conservative treatments.  Admit for right THA.  Hx of HTN.     PT Comments    Pt continues to be limited by pain with mobility, but is able to move better this PM session. Pt has LOB posteriorly x 2 requiring Min A to correct which wife reports has been an issue for the past couple of years. Pt will benefit from continued therapy after discharge in order to assist with a return to PLOF.    Follow Up Recommendations  Home health PT;Supervision/Assistance - 24 hour     Equipment Recommendations  Rolling walker with 5" wheels;3in1 (PT)    Recommendations for Other Services       Precautions / Restrictions Precautions Precautions: Fall Restrictions Weight Bearing Restrictions: Yes RLE Weight Bearing: Weight bearing as tolerated    Mobility  Bed Mobility               General bed mobility comments: up in chair  Transfers Overall transfer level: Needs assistance Equipment used: Rolling walker (2 wheeled) Transfers: Sit to/from Stand Sit to Stand: Min assist         General transfer comment: from recliner with with cues for sequencing and to assist with stabilizing once in standing as patient tends to lose his balanace posteriorly  Ambulation/Gait Ambulation/Gait assistance: Min guard Ambulation Distance (Feet): 125 Feet Assistive device: Rolling walker (2 wheeled) Gait Pattern/deviations: Step-to pattern;Decreased step length - right;Decreased stance time - right;Decreased weight shift to right;Decreased stride length;Antalgic;Trunk flexed Gait velocity: decreased Gait velocity interpretation: Below normal speed for age/gender General Gait Details: Pt needed cues  for sequencing steps and demonstrates improved knee flexion with swing. Pt also needed cues for upright posture.    Stairs            Wheelchair Mobility    Modified Rankin (Stroke Patients Only)       Balance           Standing balance support: Bilateral upper extremity supported;During functional activity Standing balance-Leahy Scale: Poor Standing balance comment: needs to use walker in standing and leans posteriorly without support                    Cognition Arousal/Alertness: Awake/alert Behavior During Therapy: WFL for tasks assessed/performed Overall Cognitive Status: Within Functional Limits for tasks assessed                      Exercises Total Joint Exercises Ankle Circles/Pumps: AROM;Both;10 reps;Supine Short Arc Quad: AROM;Right;10 reps;Supine Hip ABduction/ADduction: AAROM;Right;10 reps;Supine Long Arc Quad: AROM;Right;10 reps;Seated    General Comments        Pertinent Vitals/Pain Pain Assessment: 0-10 Pain Score: 3  Pain Location: right hip Pain Descriptors / Indicators: Discomfort Pain Intervention(s): Limited activity within patient's tolerance;Monitored during session;Premedicated before session;Ice applied    Home Living                      Prior Function            PT Goals (current goals can now be found in the care plan section) Acute Rehab PT Goals Patient Stated Goal: To get back to walking better. Progress towards PT goals: Progressing  toward goals    Frequency    7X/week      PT Plan Current plan remains appropriate    Co-evaluation             End of Session Equipment Utilized During Treatment: Gait belt Activity Tolerance: Patient tolerated treatment well Patient left: in chair;with call bell/phone within reach;with family/visitor present   PT Visit Diagnosis: Pain;Unsteadiness on feet (R26.81);Other abnormalities of gait and mobility (R26.89);Difficulty in walking, not  elsewhere classified (R26.2) Pain - Right/Left: Right Pain - part of body: Hip     Time: [id number] PT Time Calculation (min) (ACUTE ONLY): 25 min  Charges:  $Gait Training: 8-22 mins $Therapeutic Exercise: 8-22 mins                    G Codes:       Scheryl Marten PT, DPT  [phone]  10/10/2016, 4:40 PM

## 2016-10-10 NOTE — Significant Event (Signed)
Rapid Response Event Note  Overview: Time Called: [id number] Arrival Time: [id number] Event Type: Neurologic  Initial Focused Assessment: Patient sitting on the bsc having a bowel movement.  Per wife he had double vision and jerking movements with his arms.   Upon my arrival patient is alert and oriented.  Still sitting on the BSC, gassy BM. BP 152/75  HR 111  RR 16  o2 sat 96% on RA He is still a little sweaty but has also taken tylenol for a fever during the night.  Temp 98.8 oral. Assisted back to bed with walker Patient states he feel much better.  Interventions:  Plan of Care (if not transferred): RN to call if patient continues to remain diaphoretic or any additional concerns.  1130  Patient feeling well, worked with PT without any problems  Event Summary:   at      at       Event End Time: 0820  Raliegh Ip

## 2016-10-10 NOTE — Progress Notes (Signed)
Physical Therapy Treatment Patient Details Name: Alex Carlson MRN: [id number] DOB: [birth year] Today's Date: 10/10/2016    History of Present Illness Alex Carlson, 76 y.o. male, has a history of pain and functional disability in the right hip(s) due to arthritis and patient has failed non-surgical conservative treatments.  Admit for right THA.  Hx of HTN.     PT Comments    Pt admitted with above diagnosis. Pt currently with functional limitations due to balance and endurance deficits. Pt was able to incr ambulation distance today.  Pt still needs cues for aspects of mobility but wife demonstrates appropriate cuing with all aspects.  Will continue PT.  Pt will benefit from skilled PT to increase their independence and safety with mobility to allow discharge to the venue listed below.     Follow Up Recommendations  Home health PT;Supervision/Assistance - 24 hour     Equipment Recommendations  Rolling walker with 5" wheels;3in1 (PT) (wife purchased gait belt from gift shop)    Recommendations for Other Services       Precautions / Restrictions Precautions Precautions: Fall Restrictions Weight Bearing Restrictions: No RLE Weight Bearing: Weight bearing as tolerated    Mobility  Bed Mobility Overal bed mobility: Needs Assistance Bed Mobility: Supine to Sit;Sit to Supine     Supine to sit: Mod assist;Min assist     General bed mobility comments: Pt needs min to mod assist for lifting the RLE  out to the edge of the bed.  Also needed assist to hold trunk upright in the transition of the movement.    Transfers Overall transfer level: Needs assistance Equipment used: Rolling walker (2 wheeled) Transfers: Sit to/from Stand Sit to Stand: Min assist;Mod assist         General transfer comment: min assist from the EOB and chair.  Did not have posterior lean today and was able to balance without PT assist.    Ambulation/Gait Ambulation/Gait assistance: Min  guard Ambulation Distance (Feet): 125 Feet Assistive device: Rolling walker (2 wheeled) Gait Pattern/deviations: Step-to pattern;Decreased step length - right;Decreased stance time - right;Decreased weight shift to right;Decreased stride length;Antalgic;Trunk flexed   Gait velocity interpretation: Below normal speed for age/gender General Gait Details: Pt needed cues for sequencing steps and RW but not as many cues as last visit.  Pt also needed cues for upright posture.    Stairs            Wheelchair Mobility    Modified Rankin (Stroke Patients Only)       Balance Overall balance assessment: Needs assistance;History of Falls Sitting-balance support: No upper extremity supported;Feet supported Sitting balance-Leahy Scale: Fair Sitting balance - Comments: Once to EOB, good balance.    Standing balance support: Bilateral upper extremity supported;During functional activity Standing balance-Leahy Scale: Poor Standing balance comment: He needs UE support on the walker in standing.              High level balance activites: Direction changes;Sudden stops High Level Balance Comments: min guard assist with transitions.      Cognition Arousal/Alertness: Awake/alert Behavior During Therapy: WFL for tasks assessed/performed Overall Cognitive Status: Within Functional Limits for tasks assessed                      VSSExercises Total Joint Exercises Ankle Circles/Pumps: AROM;Both;10 reps;Supine Quad Sets: AROM;Both;10 reps;Supine Heel Slides: AAROM;Right;10 reps;Supine Hip ABduction/ADduction: AAROM;Right;10 reps;Supine;Standing Long Arc Quad: AROM;Right;10 reps;Seated Knee Flexion: AROM;Right;Standing;10 reps Marching in Standing: AROM;Right;Standing;10 reps Standing Hip  Extension: AROM;Right;Standing;10 reps    General Comments General comments (skin integrity, edema, etc.): Wife present and continued education with pt and wife.  Wife bought gait belt.         Pertinent Vitals/Pain Pain Assessment: 0-10 Pain Score: 3  Pain Location: right hip Pain Descriptors / Indicators: Discomfort Pain Intervention(s): Limited activity within patient's tolerance;Monitored during session;Premedicated before session;RepositionedVSS    Home Living                      Prior Function            PT Goals (current goals can now be found in the care plan section) Acute Rehab PT Goals Patient Stated Goal: To get back to walking better. Progress towards PT goals: Progressing toward goals    Frequency    7X/week      PT Plan Current plan remains appropriate    Co-evaluation             End of Session Equipment Utilized During Treatment: Gait belt Activity Tolerance: Patient limited by fatigue;Patient limited by pain Patient left: in chair;with call bell/phone within reach;with family/visitor present Nurse Communication: Mobility status PT Visit Diagnosis: Pain;Unsteadiness on feet (R26.81);Other abnormalities of gait and mobility (R26.89);Difficulty in walking, not elsewhere classified (R26.2) Pain - Right/Left: Right Pain - part of body: Hip     Time: 0915-0955 PT Time Calculation (min) (ACUTE ONLY): 40 min  Charges:  $Gait Training: 8-22 mins $Therapeutic Exercise: 8-22 mins $Self Care/Home Management: 8-22                    G Codes:       Godfrey Pick Valon Glasscock 10-16-2016, 11:00 AM Lariah Fleer,PT Acute Rehabilitation [phone] (pager)

## 2016-10-11 LAB — GLUCOSE, CAPILLARY
GLUCOSE-CAPILLARY: 156 mg/dL — AB (ref 65–99)
GLUCOSE-CAPILLARY: 190 mg/dL — AB (ref 65–99)

## 2016-10-11 MED ORDER — OXYCODONE-ACETAMINOPHEN 5-325 MG PO TABS: 1.0000 | ORAL_TABLET | ORAL | 0 refills | Status: DC | PRN

## 2016-10-11 MED ORDER — ASPIRIN 81 MG PO CHEW: 81.0000 mg | CHEWABLE_TABLET | Freq: Two times a day (BID) | ORAL | 0 refills | Status: DC

## 2016-10-11 MED ORDER — METHOCARBAMOL 500 MG PO TABS: 500.0000 mg | ORAL_TABLET | Freq: Four times a day (QID) | ORAL | 0 refills | Status: DC | PRN

## 2016-10-11 NOTE — Care Management Note (Signed)
Case Management Note  Patient Details  Name: Rockne Saldutti MRN: [id number] Date of Birth: 07-18-1941  Subjective/Objective:   76 yr old male s/p right total hip arthroplasty.                 Action/Plan: Case manager spoke with patient and his wife concerning Home health and DME needs. Patient was preoperatively setup with [facility], no changes. CM has ordered Rolling walker and 3in1. Patient will have family support at discharge.    Expected Discharge Date:  10/11/16               Expected Discharge Plan:  [place]  In-House Referral:  NA  Discharge planning Services  CM Consult  Post Acute Care Choice:  Durable Medical Equipment Choice offered to:  Patient, Spouse  DME Arranged:  3-N-1, Walker rolling DME Agency:  [place]:  PT [place] Agency:  [facility] (formerly [facility])  Status of Service:  Completed, signed off  If discussed at [employer], dates discussed:    Additional Comments:  Ninfa Meeker, RN 10/11/2016, 1:32 PM

## 2016-10-11 NOTE — Progress Notes (Signed)
Physical Therapy Treatment Patient Details Name: Alex Carlson MRN: [id number] DOB: 1941-03-13 Today's Date: 10/11/2016    History of Present Illness Alex Carlson, 76 y.o. male, has a history of pain and functional disability in the right hip(s) due to arthritis and patient has failed non-surgical conservative treatments.  Admit for right THA.  Hx of HTN.     PT Comments    Pt continues to be moving well with therapy this session. Improved cadence, step length and sequencing noted this session. Pt is making good progress with gait and transfers as compared to yesterday. Pt is ready for discharge home one cleared by physician.     Follow Up Recommendations  Home health PT;Supervision/Assistance - 24 hour     Equipment Recommendations  Rolling walker with 5" wheels;3in1 (PT)    Recommendations for Other Services       Precautions / Restrictions Precautions Precautions: Fall Restrictions Weight Bearing Restrictions: Yes RLE Weight Bearing: Weight bearing as tolerated    Mobility  Bed Mobility               General bed mobility comments: up in chair  Transfers Overall transfer level: Needs assistance Equipment used: Rolling walker (2 wheeled) Transfers: Sit to/from Stand Sit to Stand: Min guard         General transfer comment: Min guard this session from recliner with cues for sequencing and to hold recliner which is not very stable  Ambulation/Gait Ambulation/Gait assistance: Min guard Ambulation Distance (Feet): 175 Feet Assistive device: Rolling walker (2 wheeled) Gait Pattern/deviations: Step-to pattern;Step-through pattern;Antalgic Gait velocity: decreased Gait velocity interpretation: Below normal speed for age/gender General Gait Details: Pt needed cues for sequencing steps and demonstrates improved knee flexion with swing. Pt also needed cues for upright posture.    Stairs            Wheelchair Mobility    Modified Rankin (Stroke  Patients Only)       Balance                                    Cognition Arousal/Alertness: Awake/alert Behavior During Therapy: WFL for tasks assessed/performed Overall Cognitive Status: Within Functional Limits for tasks assessed                      Exercises Total Joint Exercises Hip ABduction/ADduction: AROM;Right;10 reps;Standing Knee Flexion: AROM;Right;Standing;10 reps Marching in Standing: AROM;Right;Standing;10 reps Standing Hip Extension: AROM;Right;Standing;10 reps    General Comments        Pertinent Vitals/Pain Pain Assessment: 0-10 Pain Score: 2  Pain Location: right hip Pain Descriptors / Indicators: Discomfort Pain Intervention(s): Monitored during session;Ice applied    Home Living                      Prior Function            PT Goals (current goals can now be found in the care plan section) Acute Rehab PT Goals Patient Stated Goal: To get back to walking better. Progress towards PT goals: Progressing toward goals    Frequency    7X/week      PT Plan Current plan remains appropriate    Co-evaluation             End of Session Equipment Utilized During Treatment: Gait belt Activity Tolerance: Patient tolerated treatment well Patient left: Other (comment) (on [facility]) Nurse Communication: Mobility  status PT Visit Diagnosis: Pain;Unsteadiness on feet (R26.81);Other abnormalities of gait and mobility (R26.89);Difficulty in walking, not elsewhere classified (R26.2) Pain - Right/Left: Right Pain - part of body: Hip     Time: 0950-1009 PT Time Calculation (min) (ACUTE ONLY): 19 min  Charges:  $Gait Training: 8-22 mins                    G Codes:       Scheryl Marten PT, DPT  [phone]  10/11/2016, 10:20 AM

## 2016-10-11 NOTE — Progress Notes (Signed)
Patient ID: Alex Carlson, male   DOB: 10-30-1940, 76 y.o.   MRN: [id number] Doing better.  Vitals stable.  Can be discharged to home today.

## 2016-10-11 NOTE — Discharge Instructions (Signed)

## 2016-10-11 NOTE — Progress Notes (Signed)
Pt discharged from unit with wife accompanying. All personal belongings with pt. Discharge instructions and d/c rx reviewed with pt and wife. DME with pt.  Pt expresses no c/o pain or discomfort.

## 2016-10-11 NOTE — Discharge Summary (Signed)
Patient ID: Alex Carlson MRN: [id number] DOB/AGE: 05/10/1941 76 y.o.  Admit date: 10/08/2016 Discharge date: 10/11/2016  Admission Diagnoses:  Principal Problem:   Unilateral primary osteoarthritis, right hip Active Problems:   Status post total replacement of right hip   Discharge Diagnoses:  Same  Past Medical History:  Diagnosis Date  . Arthritis    "hips" (10/09/2016)  . GERD (gastroesophageal reflux disease)   . High cholesterol   . Hypertension   . Type II diabetes mellitus ([place])     Surgeries: Procedure(s): RIGHT TOTAL HIP ARTHROPLASTY ANTERIOR APPROACH on 10/08/2016   Consultants:   Discharged Condition: Improved  Hospital Course: Horacio Paller is an 76 y.o. male who was admitted 10/08/2016 for operative treatment ofUnilateral primary osteoarthritis, right hip. Patient has severe unremitting pain that affects sleep, daily activities, and work/hobbies. After pre-op clearance the patient was taken to the operating room on 10/08/2016 and underwent  Procedure(s): RIGHT TOTAL HIP ARTHROPLASTY ANTERIOR APPROACH.    Patient was given perioperative antibiotics: Anti-infectives    Start     Dose/Rate Route Frequency Ordered Stop   10/08/16 1900  ceFAZolin (ANCEF) IVPB 1 g/50 mL premix     1 g 100 mL/hr over 30 Minutes Intravenous Every 6 hours 10/08/16 1750 10/09/16 0130   10/08/16 1230  ceFAZolin (ANCEF) IVPB 2g/100 mL premix     2 g 200 mL/hr over 30 Minutes Intravenous To ShortStay Surgical 10/07/16 1046 10/08/16 1356       Patient was given sequential compression devices, early ambulation, and chemoprophylaxis to prevent DVT.  Patient benefited maximally from hospital stay and there were no complications.    Recent vital signs: Patient Vitals for the past 24 hrs:  BP Temp Temp src Pulse Resp SpO2  10/11/16 0621 136/74 (!) 100.4 F (38 C) Oral (!) 106 18 95 %  10/10/16 2059 139/65 99 F (37.2 C) Oral 89 18 96 %  10/10/16 1409 129/64 (!) 100.5 F (38.1 C) -  (!) 113 18 96 %  10/10/16 0800 (!) 152/75 - - (!) 111 - 96 %     Recent laboratory studies:  Recent Labs  10/09/16 0436  WBC 7.3  HGB 12.4*  HCT 37.8*  PLT 186  NA 133*  K 4.0  CL 102  CO2 23  BUN 10  CREATININE 0.84  GLUCOSE 197*  CALCIUM 8.8*     Discharge Medications:   Allergies as of 10/11/2016      Reactions   Lisinopril Swelling   SWELLING OF LIPS      Medication List    STOP taking these medications   aspirin 81 MG tablet Replaced by:  aspirin 81 MG chewable tablet     TAKE these medications   amLODipine 5 MG tablet Commonly known as:  NORVASC Take 5 mg by mouth daily.   aspirin 81 MG chewable tablet Chew 1 tablet (81 mg total) by mouth 2 (two) times daily. Replaces:  aspirin 81 MG tablet   CALCIUM 1200 PO Take 1,200 mg by mouth 2 (two) times daily.   [employer] FISH OIL 1200 MG Caps Take 1,200 mg by mouth 2 (two) times daily.   GLIPIZIDE XL 10 MG 24 hr tablet Generic drug:  glipiZIDE Take 10 mg by mouth daily with breakfast.   lovastatin 40 MG tablet Commonly known as:  MEVACOR Take 40 mg by mouth daily.   metFORMIN 1000 MG tablet Commonly known as:  GLUCOPHAGE Take 500-1,000 mg by mouth 2 (two) times daily with a meal.  500 mg in the morning and  1000 mg in the evening   methocarbamol 500 MG tablet Commonly known as:  ROBAXIN Take 1 tablet (500 mg total) by mouth every 6 (six) hours as needed for muscle spasms.   oxyCODONE-acetaminophen 5-325 MG tablet Commonly known as:  ROXICET Take 1-2 tablets by mouth every 4 (four) hours as needed.   pantoprazole 40 MG tablet Commonly known as:  PROTONIX Take 40 mg by mouth every evening.   TYLENOL ARTHRITIS PAIN 650 MG CR tablet Generic drug:  acetaminophen Take 1,300 mg by mouth every 8 (eight) hours as needed for pain.   Vitamin D3 1000 units Caps Take 1,000 Units by mouth every evening.            Durable Medical Equipment        Start     Ordered   10/11/16 [phone]  For home use only  DME 3 n 1  Once     10/11/16 0650      Diagnostic Studies: Dg Pelvis Portable  Result Date: 10/08/2016 CLINICAL DATA:  Right hip replacement EXAM: PORTABLE PELVIS 1-2 VIEWS COMPARISON:  09/11/2016 FINDINGS: Right hip replacement in satisfactory position alignment. No fracture or immediate complication Advanced degenerative changes in the left hip joint. Lumbar degenerative change. IMPRESSION: Satisfactory right hip replacement. Electronically Signed   By: Franchot Gallo M.D.   On: 10/08/2016 16:06   Dg C-arm 61-120 Min  Result Date: 10/08/2016 CLINICAL DATA:  Right hip arthroplasty. EXAM: DG C-ARM 61-120 MIN; OPERATIVE RIGHT HIP WITH PELVIS COMPARISON:  09/11/2016 FINDINGS: Fluoroscopic intraoperative view from right hip arthroplasty demonstrate placement of 3 component prosthetic right hip in anatomic alignment. There is no evidence of fracture or other immediate complications. Fluoroscopy time is recorded as 24 seconds. IMPRESSION: Intraoperative images from total right hip arthroplasty without evidence of immediate complications. Electronically Signed   By: Fidela Salisbury M.D.   On: 10/08/2016 15:24   Dg Hip Operative Unilat With Pelvis Right  Result Date: 10/08/2016 CLINICAL DATA:  Right hip arthroplasty. EXAM: DG C-ARM 61-120 MIN; OPERATIVE RIGHT HIP WITH PELVIS COMPARISON:  09/11/2016 FINDINGS: Fluoroscopic intraoperative view from right hip arthroplasty demonstrate placement of 3 component prosthetic right hip in anatomic alignment. There is no evidence of fracture or other immediate complications. Fluoroscopy time is recorded as 24 seconds. IMPRESSION: Intraoperative images from total right hip arthroplasty without evidence of immediate complications. Electronically Signed   By: Fidela Salisbury M.D.   On: 10/08/2016 15:24   Xr Hip Unilat W Or W/o Pelvis 2-3 Views Right  Result Date: 09/11/2016 An AP pelvis and lateral of his right hip shows severe end-stage arthritis. There is  complete loss of superior lateral joint space. There sclerotic and cystic changes in both the femoral head and the acetabulum.  Xr Knee 1-2 Views Left  Result Date: 09/11/2016 An AP and lateral of the left knee shows medial joint space narrowing and moderate try confirm arthritic changes but mainly the medial joint line. There is no effusion in no acute changes.  Xr Knee 1-2 Views Right  Result Date: 09/11/2016 An AP and lateral of his right knee shows moderate try confirm arthritis mainly of the medial compartment. He has a slight varus deformity and no effusion.   Disposition: to home  Discharge Instructions    Discharge patient    Complete by:  As directed    Discharge disposition:  01-Home or Self Care   Discharge patient date:  10/11/2016  Follow-up Information    Mcarthur Rossetti, MD Follow up in 2 week(s).   Specialty:  Orthopedic Surgery Contact information: [place] [address] [phone]            Signed: Mcarthur Rossetti 10/11/2016, 6:52 AM

## 2016-10-12 DIAGNOSIS — Z471 Aftercare following joint replacement surgery: Secondary | ICD-10-CM | POA: Diagnosis not present

## 2016-10-12 DIAGNOSIS — E119 Type 2 diabetes mellitus without complications: Secondary | ICD-10-CM | POA: Diagnosis not present

## 2016-10-12 DIAGNOSIS — I10 Essential (primary) hypertension: Secondary | ICD-10-CM | POA: Diagnosis not present

## 2016-10-12 DIAGNOSIS — Z96641 Presence of right artificial hip joint: Secondary | ICD-10-CM | POA: Diagnosis not present

## 2016-10-12 DIAGNOSIS — M1990 Unspecified osteoarthritis, unspecified site: Secondary | ICD-10-CM | POA: Diagnosis not present

## 2016-10-12 DIAGNOSIS — Z87891 Personal history of nicotine dependence: Secondary | ICD-10-CM | POA: Diagnosis not present

## 2016-10-14 ENCOUNTER — Telehealth (INDEPENDENT_AMBULATORY_CARE_PROVIDER_SITE_OTHER): Payer: Self-pay | Admitting: Orthopaedic Surgery

## 2016-10-14 DIAGNOSIS — Z87891 Personal history of nicotine dependence: Secondary | ICD-10-CM | POA: Diagnosis not present

## 2016-10-14 DIAGNOSIS — Z471 Aftercare following joint replacement surgery: Secondary | ICD-10-CM | POA: Diagnosis not present

## 2016-10-14 DIAGNOSIS — I10 Essential (primary) hypertension: Secondary | ICD-10-CM | POA: Diagnosis not present

## 2016-10-14 DIAGNOSIS — E119 Type 2 diabetes mellitus without complications: Secondary | ICD-10-CM | POA: Diagnosis not present

## 2016-10-14 DIAGNOSIS — Z96641 Presence of right artificial hip joint: Secondary | ICD-10-CM | POA: Diagnosis not present

## 2016-10-14 DIAGNOSIS — M1990 Unspecified osteoarthritis, unspecified site: Secondary | ICD-10-CM | POA: Diagnosis not present

## 2016-10-14 NOTE — Telephone Encounter (Signed)
Mechele Claude from [facility] req verbal orders Cb#: [phone]  Started PT sat 10/12/16 1 wk 1x  3x 2nd  Week  then 2x for 2 weeks

## 2016-10-14 NOTE — Telephone Encounter (Signed)
Verbal order given to [facility]

## 2016-10-16 DIAGNOSIS — Z96641 Presence of right artificial hip joint: Secondary | ICD-10-CM | POA: Diagnosis not present

## 2016-10-16 DIAGNOSIS — I10 Essential (primary) hypertension: Secondary | ICD-10-CM | POA: Diagnosis not present

## 2016-10-16 DIAGNOSIS — M1990 Unspecified osteoarthritis, unspecified site: Secondary | ICD-10-CM | POA: Diagnosis not present

## 2016-10-16 DIAGNOSIS — Z87891 Personal history of nicotine dependence: Secondary | ICD-10-CM | POA: Diagnosis not present

## 2016-10-16 DIAGNOSIS — Z471 Aftercare following joint replacement surgery: Secondary | ICD-10-CM | POA: Diagnosis not present

## 2016-10-16 DIAGNOSIS — E119 Type 2 diabetes mellitus without complications: Secondary | ICD-10-CM | POA: Diagnosis not present

## 2016-10-18 DIAGNOSIS — I10 Essential (primary) hypertension: Secondary | ICD-10-CM | POA: Diagnosis not present

## 2016-10-18 DIAGNOSIS — Z96641 Presence of right artificial hip joint: Secondary | ICD-10-CM | POA: Diagnosis not present

## 2016-10-18 DIAGNOSIS — M1990 Unspecified osteoarthritis, unspecified site: Secondary | ICD-10-CM | POA: Diagnosis not present

## 2016-10-18 DIAGNOSIS — E119 Type 2 diabetes mellitus without complications: Secondary | ICD-10-CM | POA: Diagnosis not present

## 2016-10-18 DIAGNOSIS — Z87891 Personal history of nicotine dependence: Secondary | ICD-10-CM | POA: Diagnosis not present

## 2016-10-18 DIAGNOSIS — Z471 Aftercare following joint replacement surgery: Secondary | ICD-10-CM | POA: Diagnosis not present

## 2016-10-21 DIAGNOSIS — I10 Essential (primary) hypertension: Secondary | ICD-10-CM | POA: Diagnosis not present

## 2016-10-21 DIAGNOSIS — E119 Type 2 diabetes mellitus without complications: Secondary | ICD-10-CM | POA: Diagnosis not present

## 2016-10-21 DIAGNOSIS — Z471 Aftercare following joint replacement surgery: Secondary | ICD-10-CM | POA: Diagnosis not present

## 2016-10-21 DIAGNOSIS — Z87891 Personal history of nicotine dependence: Secondary | ICD-10-CM | POA: Diagnosis not present

## 2016-10-21 DIAGNOSIS — M1990 Unspecified osteoarthritis, unspecified site: Secondary | ICD-10-CM | POA: Diagnosis not present

## 2016-10-21 DIAGNOSIS — Z96641 Presence of right artificial hip joint: Secondary | ICD-10-CM | POA: Diagnosis not present

## 2016-10-22 ENCOUNTER — Ambulatory Visit (INDEPENDENT_AMBULATORY_CARE_PROVIDER_SITE_OTHER): Payer: Medicare Other | Admitting: Physician Assistant

## 2016-10-22 DIAGNOSIS — Z96641 Presence of right artificial hip joint: Secondary | ICD-10-CM

## 2016-10-22 NOTE — Progress Notes (Signed)
   Office Visit Note   Patient: Alex Carlson           Date of Birth: 1941-05-15           MRN: [id number] Visit Date: 10/22/2016              Requested by: No referring provider defined for this encounter. PCP: Alex Caraway, MD   Assessment & Plan: Visit Diagnoses: No diagnosis found.  Plan: Continue physical therapy for strengthening and gait balance right hip. Scar Tissue mobilization encouraged. He'll go back to just is 81 mg aspirin that he was taking prior to surgery him back in a month sooner if there is any questions or concerns  Follow-Up Instructions: No Follow-up on file.   Orders:  No orders of the defined types were placed in this encounter.  No orders of the defined types were placed in this encounter.     Procedures: No procedures performed   Clinical Data: No additional findings.   Subjective: No chief complaint on file.   HPI [name] turns today 2 weeks status post right total hip arthroplasty. He states he is overall doing very well has no complaints. Taking no pain medications. States he has some soreness but no real pain. He's had no chest pain shortness breath, calf pain, fevers or chills. Review of Systems   Objective: Vital Signs: There were no vitals taken for this visit.  Physical Exam Well-developed well-nourished male in no acute distress. Affect appropriate Ortho Exam Right hip surgical incisions healing well he does have a seroma. Aspiration performed 45 mL of serosanguineous fluids replacement. Stiffness is expected of the right hip with range of motion. He is able to ambulate on his own without any assistive device. Specialty Comments:  No specialty comments available.  Imaging: No results found.   PMFS History: Patient Active Problem List   Diagnosis Date Noted  . Unilateral primary osteoarthritis, right hip 10/08/2016  . Status post total replacement of right hip 10/08/2016   Past Medical History:  Diagnosis Date    . Arthritis    "hips" (10/09/2016)  . GERD (gastroesophageal reflux disease)   . High cholesterol   . Hypertension   . Type II diabetes mellitus ([place])     No family history on file.  Past Surgical History:  Procedure Laterality Date  . COLONOSCOPY    . JOINT REPLACEMENT    . TOTAL HIP ARTHROPLASTY Right 10/08/2016   Procedure: RIGHT TOTAL HIP ARTHROPLASTY ANTERIOR APPROACH;  Surgeon: Alex Rossetti, MD;  Location: [place];  Service: Orthopedics;  Laterality: Right;   Social History   Occupational History  . Not on file.   Social History Main Topics  . Smoking status: Former Smoker    Years: 40.00    Types: Cigarettes  . Smokeless tobacco: Never Used     Comment: 10/09/2016 "stopped smoking in the mid 1990s; only smoked on the weekends"  . Alcohol use 2.4 oz/week    4 Shots of liquor per week  . Drug use: No  . Sexual activity: Yes

## 2016-10-23 DIAGNOSIS — Z96641 Presence of right artificial hip joint: Secondary | ICD-10-CM | POA: Diagnosis not present

## 2016-10-23 DIAGNOSIS — Z471 Aftercare following joint replacement surgery: Secondary | ICD-10-CM | POA: Diagnosis not present

## 2016-10-23 DIAGNOSIS — Z87891 Personal history of nicotine dependence: Secondary | ICD-10-CM | POA: Diagnosis not present

## 2016-10-23 DIAGNOSIS — M1990 Unspecified osteoarthritis, unspecified site: Secondary | ICD-10-CM | POA: Diagnosis not present

## 2016-10-23 DIAGNOSIS — I10 Essential (primary) hypertension: Secondary | ICD-10-CM | POA: Diagnosis not present

## 2016-10-23 DIAGNOSIS — E119 Type 2 diabetes mellitus without complications: Secondary | ICD-10-CM | POA: Diagnosis not present

## 2016-10-28 DIAGNOSIS — Z471 Aftercare following joint replacement surgery: Secondary | ICD-10-CM | POA: Diagnosis not present

## 2016-10-28 DIAGNOSIS — E119 Type 2 diabetes mellitus without complications: Secondary | ICD-10-CM | POA: Diagnosis not present

## 2016-10-28 DIAGNOSIS — Z87891 Personal history of nicotine dependence: Secondary | ICD-10-CM | POA: Diagnosis not present

## 2016-10-28 DIAGNOSIS — M1990 Unspecified osteoarthritis, unspecified site: Secondary | ICD-10-CM | POA: Diagnosis not present

## 2016-10-28 DIAGNOSIS — Z96641 Presence of right artificial hip joint: Secondary | ICD-10-CM | POA: Diagnosis not present

## 2016-10-28 DIAGNOSIS — I10 Essential (primary) hypertension: Secondary | ICD-10-CM | POA: Diagnosis not present

## 2016-10-31 DIAGNOSIS — M1990 Unspecified osteoarthritis, unspecified site: Secondary | ICD-10-CM | POA: Diagnosis not present

## 2016-10-31 DIAGNOSIS — Z96641 Presence of right artificial hip joint: Secondary | ICD-10-CM | POA: Diagnosis not present

## 2016-10-31 DIAGNOSIS — Z87891 Personal history of nicotine dependence: Secondary | ICD-10-CM | POA: Diagnosis not present

## 2016-10-31 DIAGNOSIS — I10 Essential (primary) hypertension: Secondary | ICD-10-CM | POA: Diagnosis not present

## 2016-10-31 DIAGNOSIS — Z471 Aftercare following joint replacement surgery: Secondary | ICD-10-CM | POA: Diagnosis not present

## 2016-10-31 DIAGNOSIS — E119 Type 2 diabetes mellitus without complications: Secondary | ICD-10-CM | POA: Diagnosis not present

## 2016-11-25 ENCOUNTER — Ambulatory Visit (INDEPENDENT_AMBULATORY_CARE_PROVIDER_SITE_OTHER): Payer: Medicare Other | Admitting: Orthopaedic Surgery

## 2016-11-25 DIAGNOSIS — Z96641 Presence of right artificial hip joint: Secondary | ICD-10-CM

## 2016-11-25 NOTE — Progress Notes (Signed)
The patient is now 6 weeks status post a right total hip path a plasties is doing great other than his left leg is acting up and is been having some low back pain. He has medications at home but is doesn't really like to take him at all.  His wife is with him and says that he is still walking with a limp but that is been had it for him for long period time.  On examination his right hip he has fluid range of motion of that operative hip without much difficulties. There is a little bit of pain over the mid IT band.  At this point he can take Aleve on occasion and his muscle relaxants on occasion if he needs it. We'll see him back in a month to see how is doing overall but no x-rays are needed.

## 2016-11-26 ENCOUNTER — Other Ambulatory Visit (INDEPENDENT_AMBULATORY_CARE_PROVIDER_SITE_OTHER): Payer: Self-pay

## 2016-11-26 DIAGNOSIS — Z7984 Long term (current) use of oral hypoglycemic drugs: Secondary | ICD-10-CM | POA: Diagnosis not present

## 2016-11-26 DIAGNOSIS — E11319 Type 2 diabetes mellitus with unspecified diabetic retinopathy without macular edema: Secondary | ICD-10-CM | POA: Diagnosis not present

## 2016-11-26 DIAGNOSIS — R809 Proteinuria, unspecified: Secondary | ICD-10-CM | POA: Diagnosis not present

## 2016-11-26 MED ORDER — AMOXICILLIN 500 MG PO TABS: ORAL_TABLET | ORAL | 0 refills | Status: DC

## 2016-12-04 DIAGNOSIS — Z7984 Long term (current) use of oral hypoglycemic drugs: Secondary | ICD-10-CM | POA: Diagnosis not present

## 2016-12-04 DIAGNOSIS — E782 Mixed hyperlipidemia: Secondary | ICD-10-CM | POA: Diagnosis not present

## 2016-12-04 DIAGNOSIS — I10 Essential (primary) hypertension: Secondary | ICD-10-CM | POA: Diagnosis not present

## 2016-12-04 DIAGNOSIS — R809 Proteinuria, unspecified: Secondary | ICD-10-CM | POA: Diagnosis not present

## 2016-12-04 DIAGNOSIS — K219 Gastro-esophageal reflux disease without esophagitis: Secondary | ICD-10-CM | POA: Diagnosis not present

## 2016-12-04 DIAGNOSIS — Z1389 Encounter for screening for other disorder: Secondary | ICD-10-CM | POA: Diagnosis not present

## 2016-12-04 DIAGNOSIS — E113293 Type 2 diabetes mellitus with mild nonproliferative diabetic retinopathy without macular edema, bilateral: Secondary | ICD-10-CM | POA: Diagnosis not present

## 2016-12-18 DIAGNOSIS — E119 Type 2 diabetes mellitus without complications: Secondary | ICD-10-CM | POA: Diagnosis not present

## 2016-12-18 DIAGNOSIS — H524 Presbyopia: Secondary | ICD-10-CM | POA: Diagnosis not present

## 2016-12-18 DIAGNOSIS — H35033 Hypertensive retinopathy, bilateral: Secondary | ICD-10-CM | POA: Diagnosis not present

## 2016-12-18 DIAGNOSIS — Z961 Presence of intraocular lens: Secondary | ICD-10-CM | POA: Diagnosis not present

## 2016-12-18 DIAGNOSIS — H26493 Other secondary cataract, bilateral: Secondary | ICD-10-CM | POA: Diagnosis not present

## 2016-12-18 DIAGNOSIS — H5211 Myopia, right eye: Secondary | ICD-10-CM | POA: Diagnosis not present

## 2016-12-20 DIAGNOSIS — Z23 Encounter for immunization: Secondary | ICD-10-CM | POA: Diagnosis not present

## 2016-12-20 DIAGNOSIS — S61412A Laceration without foreign body of left hand, initial encounter: Secondary | ICD-10-CM | POA: Diagnosis not present

## 2016-12-23 ENCOUNTER — Ambulatory Visit (INDEPENDENT_AMBULATORY_CARE_PROVIDER_SITE_OTHER): Payer: Medicare Other | Admitting: Orthopaedic Surgery

## 2016-12-23 DIAGNOSIS — Z96641 Presence of right artificial hip joint: Secondary | ICD-10-CM

## 2016-12-23 NOTE — Progress Notes (Signed)
The patient is now 2-1/2 months status post a right total hip replacement. He's been having some right knee pain and left hip pain but otherwise is slowly making progress. He is walking without assistive device. He takes just Tylenol arthritis for pain.  I can put his right hip and left hip the range of motion fluidly without any blocks to motion and no significant pain. His right knee has no effusion and only some slight patellofemoral crepitation but no instability on exam.  He'll continue increase activities and hopefully as his gait adjusts in his body just to this is joints were less. I'll see him back in 6 months I like a low AP pelvis at that visit.

## 2017-02-10 DIAGNOSIS — D170 Benign lipomatous neoplasm of skin and subcutaneous tissue of head, face and neck: Secondary | ICD-10-CM | POA: Diagnosis not present

## 2017-02-10 DIAGNOSIS — Z23 Encounter for immunization: Secondary | ICD-10-CM | POA: Diagnosis not present

## 2017-02-10 DIAGNOSIS — S61412S Laceration without foreign body of left hand, sequela: Secondary | ICD-10-CM | POA: Diagnosis not present

## 2017-02-28 NOTE — Anesthesia Postprocedure Evaluation (Signed)
Anesthesia Post Note  Patient: Alex Carlson  Procedure(s) Performed: Procedure(s) (LRB): RIGHT TOTAL HIP ARTHROPLASTY ANTERIOR APPROACH (Right)     Anesthesia Post Evaluation  Last Vitals:  Vitals:   10/10/16 2059 10/11/16 0621  BP: 139/65 136/74  Pulse: 89 (!) 106  Resp: 18 18  Temp: 37.2 C (!) 38 C    Last Pain:  Vitals:   10/11/16 1000  TempSrc:   PainSc: 0-No pain                 Nolon Nations

## 2017-02-28 NOTE — Addendum Note (Signed)
Addendum  created 02/28/17 5747 by Nolon Nations, MD   Sign clinical note

## 2017-06-10 DIAGNOSIS — Z5181 Encounter for therapeutic drug level monitoring: Secondary | ICD-10-CM | POA: Diagnosis not present

## 2017-06-10 DIAGNOSIS — I10 Essential (primary) hypertension: Secondary | ICD-10-CM | POA: Diagnosis not present

## 2017-06-10 DIAGNOSIS — Z79899 Other long term (current) drug therapy: Secondary | ICD-10-CM | POA: Diagnosis not present

## 2017-06-10 DIAGNOSIS — K219 Gastro-esophageal reflux disease without esophagitis: Secondary | ICD-10-CM | POA: Diagnosis not present

## 2017-06-10 DIAGNOSIS — E782 Mixed hyperlipidemia: Secondary | ICD-10-CM | POA: Diagnosis not present

## 2017-06-10 DIAGNOSIS — R809 Proteinuria, unspecified: Secondary | ICD-10-CM | POA: Diagnosis not present

## 2017-06-10 DIAGNOSIS — Z1389 Encounter for screening for other disorder: Secondary | ICD-10-CM | POA: Diagnosis not present

## 2017-06-10 DIAGNOSIS — E11319 Type 2 diabetes mellitus with unspecified diabetic retinopathy without macular edema: Secondary | ICD-10-CM | POA: Diagnosis not present

## 2017-06-12 DIAGNOSIS — I10 Essential (primary) hypertension: Secondary | ICD-10-CM | POA: Diagnosis not present

## 2017-06-12 DIAGNOSIS — E11319 Type 2 diabetes mellitus with unspecified diabetic retinopathy without macular edema: Secondary | ICD-10-CM | POA: Diagnosis not present

## 2017-06-12 DIAGNOSIS — K219 Gastro-esophageal reflux disease without esophagitis: Secondary | ICD-10-CM | POA: Diagnosis not present

## 2017-06-12 DIAGNOSIS — E1129 Type 2 diabetes mellitus with other diabetic kidney complication: Secondary | ICD-10-CM | POA: Diagnosis not present

## 2017-06-12 DIAGNOSIS — E782 Mixed hyperlipidemia: Secondary | ICD-10-CM | POA: Diagnosis not present

## 2017-06-12 DIAGNOSIS — Z23 Encounter for immunization: Secondary | ICD-10-CM | POA: Diagnosis not present

## 2017-06-12 DIAGNOSIS — M179 Osteoarthritis of knee, unspecified: Secondary | ICD-10-CM | POA: Diagnosis not present

## 2017-06-12 DIAGNOSIS — M169 Osteoarthritis of hip, unspecified: Secondary | ICD-10-CM | POA: Diagnosis not present

## 2017-06-12 DIAGNOSIS — Z86010 Personal history of colonic polyps: Secondary | ICD-10-CM | POA: Diagnosis not present

## 2017-06-12 DIAGNOSIS — Z7984 Long term (current) use of oral hypoglycemic drugs: Secondary | ICD-10-CM | POA: Diagnosis not present

## 2017-06-12 DIAGNOSIS — R809 Proteinuria, unspecified: Secondary | ICD-10-CM | POA: Diagnosis not present

## 2017-06-12 DIAGNOSIS — Z0000 Encounter for general adult medical examination without abnormal findings: Secondary | ICD-10-CM | POA: Diagnosis not present

## 2017-06-25 ENCOUNTER — Ambulatory Visit (INDEPENDENT_AMBULATORY_CARE_PROVIDER_SITE_OTHER): Payer: Medicare Other | Admitting: Orthopaedic Surgery

## 2017-06-25 ENCOUNTER — Encounter (INDEPENDENT_AMBULATORY_CARE_PROVIDER_SITE_OTHER): Payer: Self-pay | Admitting: Orthopaedic Surgery

## 2017-06-25 ENCOUNTER — Ambulatory Visit (INDEPENDENT_AMBULATORY_CARE_PROVIDER_SITE_OTHER): Payer: Medicare Other

## 2017-06-25 DIAGNOSIS — Z96641 Presence of right artificial hip joint: Secondary | ICD-10-CM | POA: Diagnosis not present

## 2017-06-25 DIAGNOSIS — M1612 Unilateral primary osteoarthritis, left hip: Secondary | ICD-10-CM | POA: Insufficient documentation

## 2017-06-25 NOTE — Progress Notes (Signed)
The patient is well-known to me.  He is a 76 year old gentleman who is now about 9 months out from a right total hip arthroplasty.  He says the right hip is doing well.  He has been having some problems with left hip pain more recently in the left hip "giving out on him".  He says the pain is not bad enough to have hip replacement surgery yet but however he may consider that sometime in the future.  He says is not as bad as when he needed his hip replaced on the right side.  He said his right hip is doing excellent.  On exam his right hip has fluid range of motion with good internal/external rotation as well as flexion extension with no pain.  His incision is well-healed.  His leg lengths are equal.  His his left hip has pain with internal/external rotation and significant stiffness with internal ex rotation.  An AP pelvis shows a well-seated implant on the right side and severe arthritis in the left hip.  This includes sclerotic and cystic changes as well as joint space narrowing.  At this point he would like to try an intra-articular injection under fluoroscopy of his left hip but that we can set up with Dr. Ernestina Patches.  He can continue strengthening exercises for the left hip as well.  From my standpoint I do not and see him back for 3 months at the 1 year standpoint for surgery but no x-rays will be needed.

## 2017-06-26 ENCOUNTER — Other Ambulatory Visit (INDEPENDENT_AMBULATORY_CARE_PROVIDER_SITE_OTHER): Payer: Self-pay

## 2017-06-26 DIAGNOSIS — M25552 Pain in left hip: Secondary | ICD-10-CM

## 2017-07-14 ENCOUNTER — Ambulatory Visit (INDEPENDENT_AMBULATORY_CARE_PROVIDER_SITE_OTHER): Payer: Medicare Other | Admitting: Physical Medicine and Rehabilitation

## 2017-07-14 ENCOUNTER — Ambulatory Visit (INDEPENDENT_AMBULATORY_CARE_PROVIDER_SITE_OTHER): Payer: Medicare Other

## 2017-07-14 DIAGNOSIS — M25552 Pain in left hip: Secondary | ICD-10-CM | POA: Diagnosis not present

## 2017-07-14 DIAGNOSIS — M1612 Unilateral primary osteoarthritis, left hip: Secondary | ICD-10-CM

## 2017-07-14 NOTE — Patient Instructions (Signed)

## 2017-07-14 NOTE — Progress Notes (Signed)
Alex Carlson - 76 y.o. male MRN [id number]  Date of birth: November 03, 1940  Office Visit Note: Visit Date: 07/14/2017 PCP: Cari Caraway, MD Referred by: Cari Caraway, MD  Subjective: Chief Complaint  Patient presents with  . Left Hip - Pain   HPI: Alex Carlson is a 76 year old gentleman who is 9 months status post right total hip replacement by Dr. Ninfa Linden.  He comes in today with worsening left hip pain.  He gets some pain in the posterior part of the hip and laterally.  He does not really endorse much groin pain.  He is very stiff with the left hip.  Dr. Ninfa Linden request diagnostic and hopefully therapeutic anesthetic hip arthrogram.    ROS Otherwise per HPI.  Assessment & Plan: Visit Diagnoses:  1. Pain in left hip   2. Unilateral primary osteoarthritis, left hip     Plan: Findings:  Diagnostic and therapeutic anesthetic hip arthrogram.  Patient did seem to have some relief during the anesthetic phase with walking.    Meds & Orders: No orders of the defined types were placed in this encounter.   Orders Placed This Encounter  Procedures  . Large Joint Inj: L hip joint  . XR C-ARM NO REPORT    Follow-up: Return if symptoms worsen or fail to improve, for Dr. Ninfa Linden.   Procedures: Large Joint Inj: L hip joint on 07/14/2017 9:27 AM Indications: pain and diagnostic evaluation Details: 22 G needle, anterior approach  Arthrogram: Yes  Medications: 3 mL bupivacaine 0.5 %; 80 mg triamcinolone acetonide 40 MG/ML Outcome: tolerated well, no immediate complications  Arthrogram demonstrated excellent flow of contrast throughout the joint surface without extravasation or obvious defect.  The patient had relief of symptoms during the anesthetic phase of the injection.  Procedure, treatment alternatives, risks and benefits explained, specific risks discussed. Consent was given by the patient. Immediately prior to procedure a time out was called to verify the correct patient,  procedure, equipment, support staff and site/side marked as required. Patient was prepped and draped in the usual sterile fashion.      No notes on file   Clinical History: No specialty comments available.  He reports that he has quit smoking. His smoking use included cigarettes. He quit after 40.00 years of use. he has never used smokeless tobacco.  Recent Labs    09/30/16 0936  HGBA1C 7.4*    Objective:  VS:  HT:    WT:   BMI:     BP:   HR: bpm  TEMP: ( )  RESP:  Physical Exam  Musculoskeletal:  Patient ambulates with antalgic gait on the left.  On exam he is very stiff with any motion of that limb.    Ortho Exam Imaging: Xr C-arm No Report  Result Date: 07/14/2017 Please see Notes or Procedures tab for imaging impression.   Past Medical/Family/Surgical/Social History: Medications & Allergies reviewed per EMR Patient Active Problem List   Diagnosis Date Noted  . Unilateral primary osteoarthritis, left hip 06/25/2017  . Unilateral primary osteoarthritis, right hip 10/08/2016  . Status post total replacement of right hip 10/08/2016   Past Medical History:  Diagnosis Date  . Arthritis    "hips" (10/09/2016)  . GERD (gastroesophageal reflux disease)   . High cholesterol   . Hypertension   . Type II diabetes mellitus ([place])    History reviewed. No pertinent family history. Past Surgical History:  Procedure Laterality Date  . COLONOSCOPY    . JOINT REPLACEMENT    .  TOTAL HIP ARTHROPLASTY Right 10/08/2016   Procedure: RIGHT TOTAL HIP ARTHROPLASTY ANTERIOR APPROACH;  Surgeon: Mcarthur Rossetti, MD;  Location: [place];  Service: Orthopedics;  Laterality: Right;   Social History   Occupational History  . Not on file  Tobacco Use  . Smoking status: Former Smoker    Years: 40.00    Types: Cigarettes  . Smokeless tobacco: Never Used  . Tobacco comment: 10/09/2016 "stopped smoking in the mid 1990s; only smoked on the weekends"  Substance and Sexual Activity  .  Alcohol use: Yes    Alcohol/week: 2.4 oz    Types: 4 Shots of liquor per week  . Drug use: No  . Sexual activity: Yes

## 2017-07-15 ENCOUNTER — Encounter (INDEPENDENT_AMBULATORY_CARE_PROVIDER_SITE_OTHER): Payer: Self-pay | Admitting: Physical Medicine and Rehabilitation

## 2017-07-15 MED ORDER — TRIAMCINOLONE ACETONIDE 40 MG/ML IJ SUSP
80.0000 mg | INTRAMUSCULAR | Status: AC | PRN
  Administered 2017-07-14: 80 mg via INTRA_ARTICULAR

## 2017-07-15 MED ORDER — BUPIVACAINE HCL 0.5 % IJ SOLN
3.0000 mL | INTRAMUSCULAR | Status: AC | PRN
  Administered 2017-07-14: 3 mL via INTRA_ARTICULAR

## 2017-08-12 IMAGING — RF DG HIP (WITH PELVIS) OPERATIVE*R*
1 series · 4 of 4 positions shown · non-contrast
Comparison: 09/11/2016

CLINICAL DATA: Right hip arthroplasty.

EXAM:
DG C-ARM 61-120 MIN; OPERATIVE RIGHT HIP WITH PELVIS

[Series 1: run · 4 of 4 slices shown]
[im 1/4]
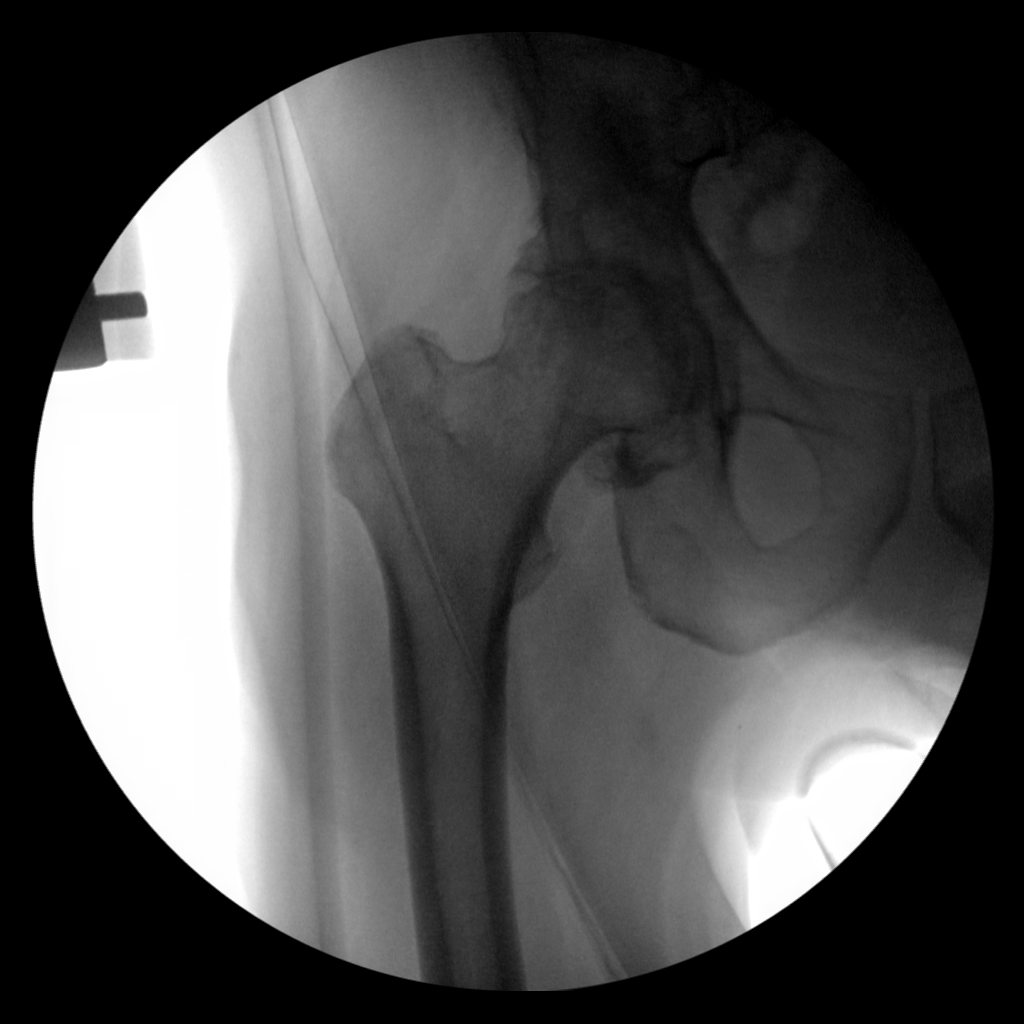
[im 2/4]
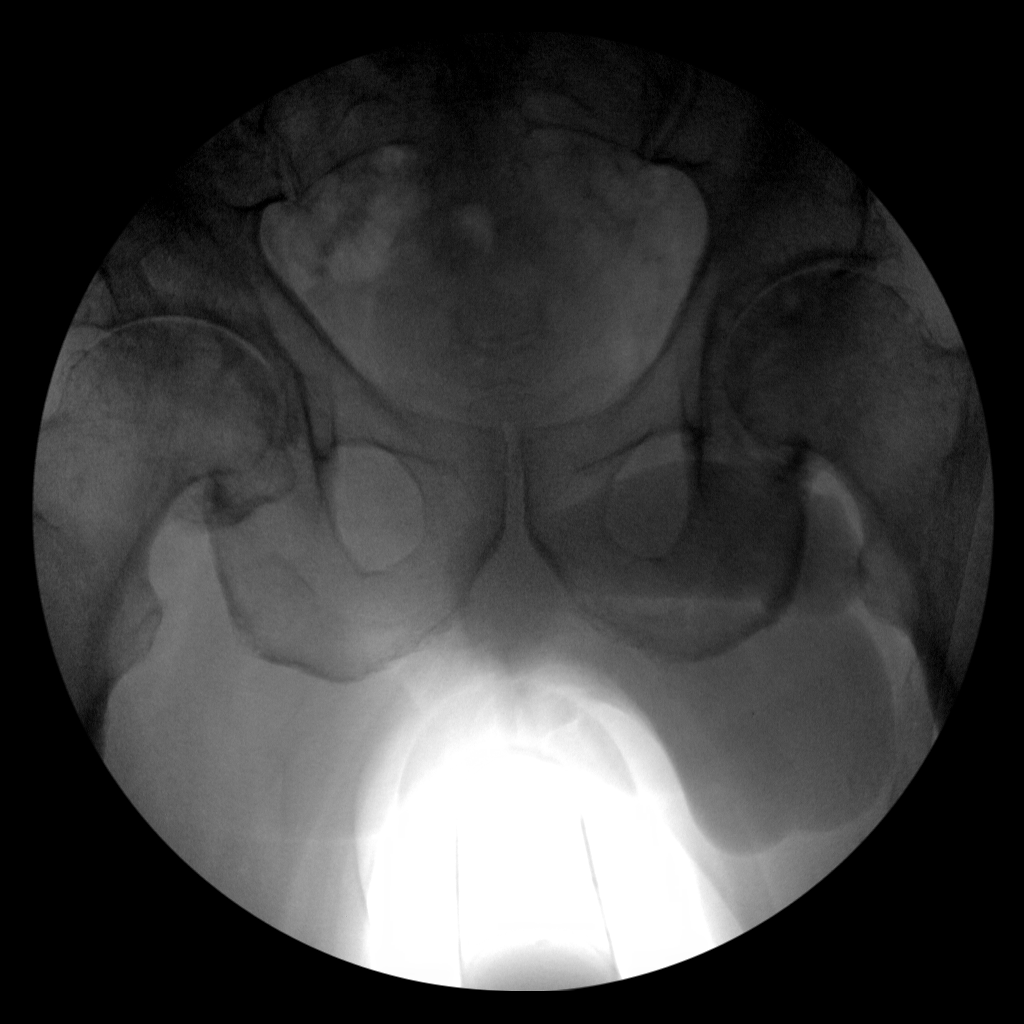
[im 3/4]
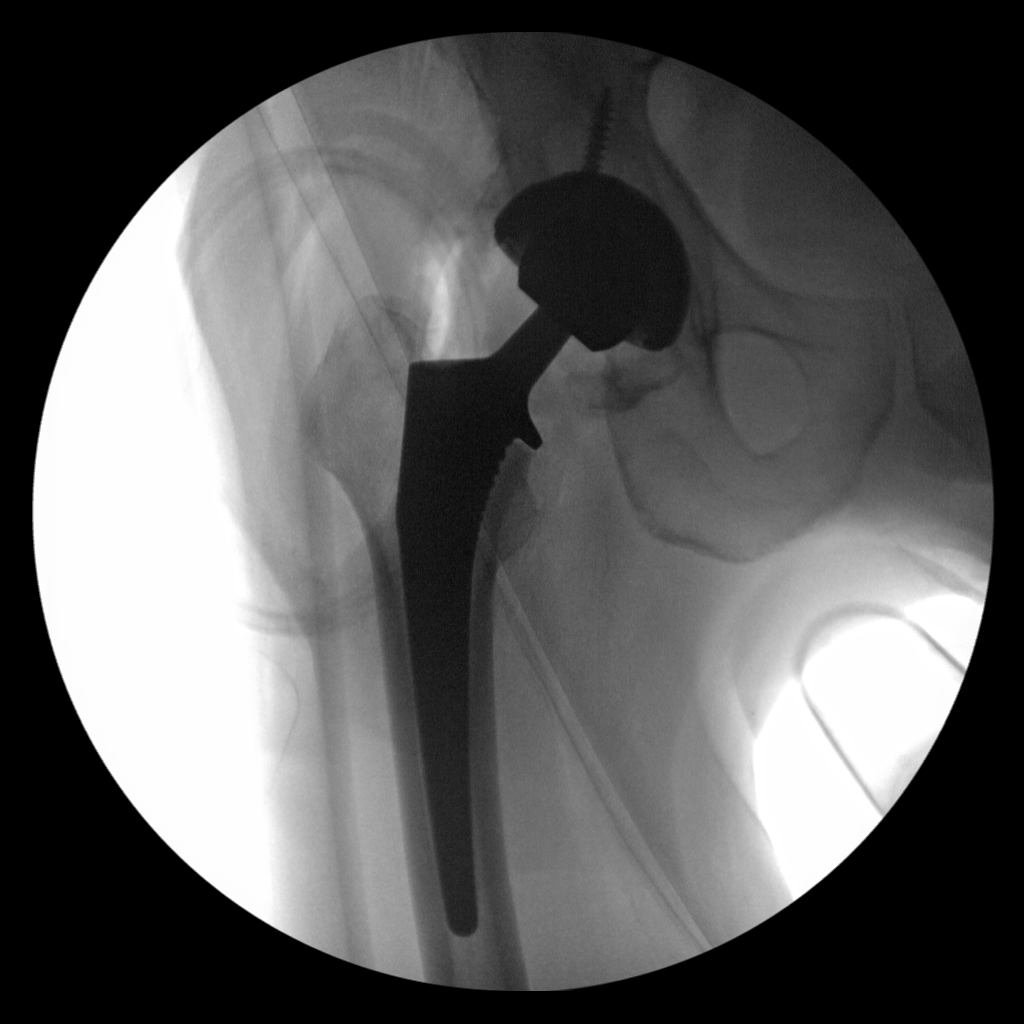
[im 4/4]
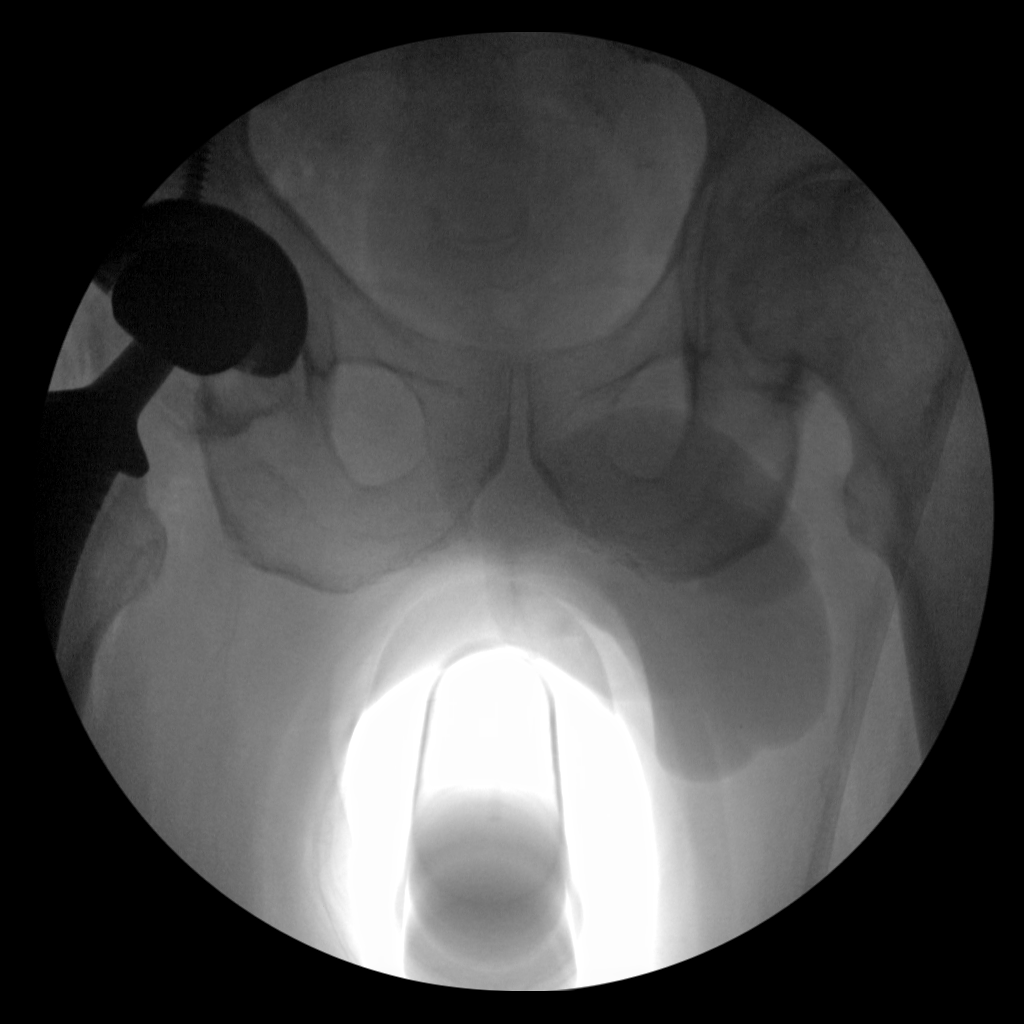

[4 of 4 positions shown; findings below may reference images not displayed]

FINDINGS: Fluoroscopic intraoperative view from right hip arthroplasty
demonstrate placement of 3 component prosthetic right hip in
anatomic alignment. There is no evidence of fracture or other
immediate complications.

Fluoroscopy time is recorded as 24 seconds.
IMPRESSION: Intraoperative images from total right hip arthroplasty without
evidence of immediate complications.

## 2017-08-27 DIAGNOSIS — Z86010 Personal history of colonic polyps: Secondary | ICD-10-CM | POA: Diagnosis not present

## 2017-08-27 DIAGNOSIS — R1310 Dysphagia, unspecified: Secondary | ICD-10-CM | POA: Diagnosis not present

## 2017-08-27 DIAGNOSIS — K219 Gastro-esophageal reflux disease without esophagitis: Secondary | ICD-10-CM | POA: Diagnosis not present

## 2017-09-24 ENCOUNTER — Ambulatory Visit (INDEPENDENT_AMBULATORY_CARE_PROVIDER_SITE_OTHER): Payer: Medicare Other | Admitting: Orthopaedic Surgery

## 2017-09-24 ENCOUNTER — Encounter (INDEPENDENT_AMBULATORY_CARE_PROVIDER_SITE_OTHER): Payer: Self-pay | Admitting: Orthopaedic Surgery

## 2017-09-24 DIAGNOSIS — M1612 Unilateral primary osteoarthritis, left hip: Secondary | ICD-10-CM | POA: Diagnosis not present

## 2017-09-24 DIAGNOSIS — Z96641 Presence of right artificial hip joint: Secondary | ICD-10-CM | POA: Diagnosis not present

## 2017-09-24 NOTE — Progress Notes (Signed)
The patient is now almost 1 year status post a right total hip arthroplasty direct anterior approach.  He is not having any issues with his hip at all.  We just x-rayed it in November  which was just 2 months ago.  He was having left hip issues.  We will set him up for an intra-articular injection of steroid under direct fluoroscopy with Dr. Ernestina Patches.  He had this on 07/14/2017.  He says that is done very well for him.  He feels like he has good range of motion and strength in both hips and knee that hurts.  On examination his leg lengths are equal.  I can put both hips through full internal extra rotation with no pain at all.  At this point since he is doing so well he will follow-up as needed.  All questions concerns were answered and addressed.  If he has any issues at all he will let us know.

## 2017-11-24 DIAGNOSIS — Z7984 Long term (current) use of oral hypoglycemic drugs: Secondary | ICD-10-CM | POA: Diagnosis not present

## 2017-11-24 DIAGNOSIS — E782 Mixed hyperlipidemia: Secondary | ICD-10-CM | POA: Diagnosis not present

## 2017-11-24 DIAGNOSIS — Z86010 Personal history of colonic polyps: Secondary | ICD-10-CM | POA: Diagnosis not present

## 2017-11-24 DIAGNOSIS — I10 Essential (primary) hypertension: Secondary | ICD-10-CM | POA: Diagnosis not present

## 2017-11-24 DIAGNOSIS — M179 Osteoarthritis of knee, unspecified: Secondary | ICD-10-CM | POA: Diagnosis not present

## 2017-11-24 DIAGNOSIS — Z0000 Encounter for general adult medical examination without abnormal findings: Secondary | ICD-10-CM | POA: Diagnosis not present

## 2017-11-24 DIAGNOSIS — E1129 Type 2 diabetes mellitus with other diabetic kidney complication: Secondary | ICD-10-CM | POA: Diagnosis not present

## 2017-11-24 DIAGNOSIS — M169 Osteoarthritis of hip, unspecified: Secondary | ICD-10-CM | POA: Diagnosis not present

## 2017-11-24 DIAGNOSIS — Z23 Encounter for immunization: Secondary | ICD-10-CM | POA: Diagnosis not present

## 2017-11-24 DIAGNOSIS — R809 Proteinuria, unspecified: Secondary | ICD-10-CM | POA: Diagnosis not present

## 2017-11-24 DIAGNOSIS — K219 Gastro-esophageal reflux disease without esophagitis: Secondary | ICD-10-CM | POA: Diagnosis not present

## 2017-11-25 DIAGNOSIS — K219 Gastro-esophageal reflux disease without esophagitis: Secondary | ICD-10-CM | POA: Diagnosis not present

## 2017-11-25 DIAGNOSIS — E782 Mixed hyperlipidemia: Secondary | ICD-10-CM | POA: Diagnosis not present

## 2017-11-25 DIAGNOSIS — S60459A Superficial foreign body of unspecified finger, initial encounter: Secondary | ICD-10-CM | POA: Diagnosis not present

## 2017-11-25 DIAGNOSIS — H35033 Hypertensive retinopathy, bilateral: Secondary | ICD-10-CM | POA: Diagnosis not present

## 2017-11-25 DIAGNOSIS — R809 Proteinuria, unspecified: Secondary | ICD-10-CM | POA: Diagnosis not present

## 2017-11-25 DIAGNOSIS — Z86010 Personal history of colonic polyps: Secondary | ICD-10-CM | POA: Diagnosis not present

## 2017-11-25 DIAGNOSIS — Z79899 Other long term (current) drug therapy: Secondary | ICD-10-CM | POA: Diagnosis not present

## 2017-11-25 DIAGNOSIS — I10 Essential (primary) hypertension: Secondary | ICD-10-CM | POA: Diagnosis not present

## 2017-11-25 DIAGNOSIS — B351 Tinea unguium: Secondary | ICD-10-CM | POA: Diagnosis not present

## 2017-12-25 DIAGNOSIS — Z79899 Other long term (current) drug therapy: Secondary | ICD-10-CM | POA: Diagnosis not present

## 2017-12-25 DIAGNOSIS — E11319 Type 2 diabetes mellitus with unspecified diabetic retinopathy without macular edema: Secondary | ICD-10-CM | POA: Diagnosis not present

## 2017-12-25 DIAGNOSIS — R809 Proteinuria, unspecified: Secondary | ICD-10-CM | POA: Diagnosis not present

## 2017-12-25 DIAGNOSIS — I10 Essential (primary) hypertension: Secondary | ICD-10-CM | POA: Diagnosis not present

## 2017-12-25 DIAGNOSIS — B351 Tinea unguium: Secondary | ICD-10-CM | POA: Diagnosis not present

## 2017-12-25 DIAGNOSIS — E782 Mixed hyperlipidemia: Secondary | ICD-10-CM | POA: Diagnosis not present

## 2017-12-25 DIAGNOSIS — Z86010 Personal history of colonic polyps: Secondary | ICD-10-CM | POA: Diagnosis not present

## 2017-12-25 DIAGNOSIS — K219 Gastro-esophageal reflux disease without esophagitis: Secondary | ICD-10-CM | POA: Diagnosis not present

## 2018-01-17 DIAGNOSIS — H35039 Hypertensive retinopathy, unspecified eye: Secondary | ICD-10-CM | POA: Insufficient documentation

## 2018-01-19 DIAGNOSIS — Z961 Presence of intraocular lens: Secondary | ICD-10-CM | POA: Diagnosis not present

## 2018-01-19 DIAGNOSIS — E119 Type 2 diabetes mellitus without complications: Secondary | ICD-10-CM | POA: Diagnosis not present

## 2018-01-19 DIAGNOSIS — H35033 Hypertensive retinopathy, bilateral: Secondary | ICD-10-CM | POA: Diagnosis not present

## 2018-01-19 DIAGNOSIS — H26493 Other secondary cataract, bilateral: Secondary | ICD-10-CM | POA: Diagnosis not present

## 2018-01-19 DIAGNOSIS — H43813 Vitreous degeneration, bilateral: Secondary | ICD-10-CM | POA: Diagnosis not present

## 2018-01-21 ENCOUNTER — Ambulatory Visit (INDEPENDENT_AMBULATORY_CARE_PROVIDER_SITE_OTHER): Payer: Medicare Other | Admitting: Physician Assistant

## 2018-01-21 ENCOUNTER — Encounter (INDEPENDENT_AMBULATORY_CARE_PROVIDER_SITE_OTHER): Payer: Self-pay | Admitting: Orthopaedic Surgery

## 2018-01-21 DIAGNOSIS — M1612 Unilateral primary osteoarthritis, left hip: Secondary | ICD-10-CM

## 2018-01-21 NOTE — Progress Notes (Signed)
HPI: Alex Carlson returns today follow-up left hip intra-articular injection by Dr. Ernestina Patches in November 2018.  He is done well until just recently and is now having pain in the hip he would like to undergo a left hip intra-articular injection end-stage arthritis of the hip.  Is also status post a right total hip arthroplasty 15 months postop.  Right hip is overall doing well he did develop some pain over the lateral aspect of the right hip but this is no longer bothering him.  Review of systems: Denies fevers, chills, nausea or vomiting.  Physical exam: General well-developed well-nourished male in no acute distress mood and affect appropriate Bilateral hips: Right hip excellent range of motion without pain.  He has some slight tenderness over the right trochanteric region.  Left hip no internal and external rotation.  Discomfort with attempts of motion.  Impression: Left hip osteoarthritis  Plan: We will have him do IT band stretching for the right hip exercises were shown.  In regards to the left hip we will set him up for an intra-articular injection of the hip with Dr. Ernestina Patches in near future.  Follow-up with [place] on as-needed basis.

## 2018-01-22 ENCOUNTER — Other Ambulatory Visit (INDEPENDENT_AMBULATORY_CARE_PROVIDER_SITE_OTHER): Payer: Self-pay

## 2018-01-22 DIAGNOSIS — M25552 Pain in left hip: Secondary | ICD-10-CM

## 2018-02-10 ENCOUNTER — Ambulatory Visit (INDEPENDENT_AMBULATORY_CARE_PROVIDER_SITE_OTHER): Payer: Self-pay

## 2018-02-10 ENCOUNTER — Ambulatory Visit (INDEPENDENT_AMBULATORY_CARE_PROVIDER_SITE_OTHER): Payer: Medicare Other | Admitting: Physical Medicine and Rehabilitation

## 2018-02-10 ENCOUNTER — Encounter (INDEPENDENT_AMBULATORY_CARE_PROVIDER_SITE_OTHER): Payer: Self-pay | Admitting: Physical Medicine and Rehabilitation

## 2018-02-10 DIAGNOSIS — M25552 Pain in left hip: Secondary | ICD-10-CM | POA: Diagnosis not present

## 2018-02-10 NOTE — Patient Instructions (Signed)

## 2018-02-10 NOTE — Progress Notes (Signed)
Alex Carlson - 77 y.o. male MRN [id number]  Date of birth: 27-Jun-1941  Office Visit Note: Visit Date: 02/10/2018 PCP: Cari Caraway, MD Referred by: Cari Caraway, MD  Subjective: Chief Complaint  Patient presents with  . Left Hip - Pain, Weakness   HPI: [name] is a 49-year-old gentleman with prior hip arthroplasty on the right with left hip pain worsening over the last several months.  Prior hip injection was beneficial.  He comes in today for diagnostic and therapeutic anesthetic hip arthrogram on the left.   ROS Otherwise per HPI.  Assessment & Plan: Visit Diagnoses:  1. Pain in left hip     Plan: No additional findings.   Meds & Orders: No orders of the defined types were placed in this encounter.   Orders Placed This Encounter  Procedures  . Large Joint Inj: L hip joint  . XR C-ARM NO REPORT    Follow-up: Return if symptoms worsen or fail to improve.   Procedures: Large Joint Inj: L hip joint on 02/10/2018 8:52 AM Indications: pain and diagnostic evaluation Details: 22 G needle, anterior approach  Arthrogram: Yes  Medications: 80 mg triamcinolone acetonide 40 MG/ML; 3 mL bupivacaine 0.5 % Outcome: tolerated well, no immediate complications  Arthrogram demonstrated excellent flow of contrast throughout the joint surface without extravasation or obvious defect.  The patient had relief of symptoms during the anesthetic phase of the injection.  Procedure, treatment alternatives, risks and benefits explained, specific risks discussed. Consent was given by the patient. Immediately prior to procedure a time out was called to verify the correct patient, procedure, equipment, support staff and site/side marked as required. Patient was prepped and draped in the usual sterile fashion.      No notes on file   Clinical History: No specialty comments available.   He reports that he has quit smoking. His smoking use included cigarettes. He quit after 40.00 years of  use. He has never used smokeless tobacco. No results for input(s): HGBA1C, LABURIC in the last 8760 hours.  Objective:  VS:  HT:    WT:   BMI:     BP:   HR: bpm  TEMP: ( )  RESP:  Physical Exam  Ortho Exam Imaging: No results found.  Past Medical/Family/Surgical/Social History: Medications & Allergies reviewed per EMR, new medications updated. Patient Active Problem List   Diagnosis Date Noted  . Unilateral primary osteoarthritis, left hip 06/25/2017  . Unilateral primary osteoarthritis, right hip 10/08/2016  . Status post total replacement of right hip 10/08/2016   Past Medical History:  Diagnosis Date  . Arthritis    "hips" (10/09/2016)  . GERD (gastroesophageal reflux disease)   . High cholesterol   . Hypertension   . Type II diabetes mellitus ([place])    History reviewed. No pertinent family history. Past Surgical History:  Procedure Laterality Date  . COLONOSCOPY    . JOINT REPLACEMENT    . TOTAL HIP ARTHROPLASTY Right 10/08/2016   Procedure: RIGHT TOTAL HIP ARTHROPLASTY ANTERIOR APPROACH;  Surgeon: Alex Rossetti, MD;  Location: [facility];  Service: Orthopedics;  Laterality: Right;   Social History   Occupational History  . Not on file  Tobacco Use  . Smoking status: Former Smoker    Years: 40.00    Types: Cigarettes  . Smokeless tobacco: Never Used  . Tobacco comment: 10/09/2016 "stopped smoking in the mid 1990s; only smoked on the weekends"  Substance and Sexual Activity  . Alcohol use: Yes  Alcohol/week: 2.4 oz    Types: 4 Shots of liquor per week  . Drug use: No  . Sexual activity: Yes

## 2018-02-10 NOTE — Progress Notes (Signed)
 .  Numeric Pain Rating Scale and Functional Assessment Average Pain 6   In the last MONTH (on 0-10 scale) has pain interfered with the following?  1. General activity like being  able to carry out your everyday physical activities such as walking, climbing stairs, carrying groceries, or moving a chair?  Rating(4)   +Driver, -BT, -Dye Allergies.  

## 2018-02-11 MED ORDER — BUPIVACAINE HCL 0.5 % IJ SOLN
3.0000 mL | INTRAMUSCULAR | Status: AC | PRN
  Administered 2018-02-10: 3 mL via INTRA_ARTICULAR

## 2018-02-11 MED ORDER — TRIAMCINOLONE ACETONIDE 40 MG/ML IJ SUSP
80.0000 mg | INTRAMUSCULAR | Status: AC | PRN
  Administered 2018-02-10: 80 mg via INTRA_ARTICULAR

## 2018-05-25 ENCOUNTER — Ambulatory Visit (INDEPENDENT_AMBULATORY_CARE_PROVIDER_SITE_OTHER): Payer: Medicare Other | Admitting: Orthopaedic Surgery

## 2018-05-25 ENCOUNTER — Encounter (INDEPENDENT_AMBULATORY_CARE_PROVIDER_SITE_OTHER): Payer: Self-pay | Admitting: Orthopaedic Surgery

## 2018-05-25 DIAGNOSIS — G8929 Other chronic pain: Secondary | ICD-10-CM | POA: Diagnosis not present

## 2018-05-25 DIAGNOSIS — M1612 Unilateral primary osteoarthritis, left hip: Secondary | ICD-10-CM | POA: Diagnosis not present

## 2018-05-25 DIAGNOSIS — M25561 Pain in right knee: Secondary | ICD-10-CM | POA: Diagnosis not present

## 2018-05-25 MED ORDER — LIDOCAINE HCL 1 % IJ SOLN
3.0000 mL | INTRAMUSCULAR | Status: AC | PRN
  Administered 2018-05-25: 3 mL

## 2018-05-25 MED ORDER — METHYLPREDNISOLONE ACETATE 40 MG/ML IJ SUSP
40.0000 mg | INTRAMUSCULAR | Status: AC | PRN
  Administered 2018-05-25: 40 mg via INTRA_ARTICULAR

## 2018-05-25 NOTE — Progress Notes (Signed)
Office Visit Note   Patient: Alex Carlson           Date of Birth: Dec 13, 1940           MRN: [id number] Visit Date: 05/25/2018              Requested by: Cari Caraway, [place], [place] [address] PCP: Cari Caraway, MD   Assessment & Plan: Visit Diagnoses:  1. Unilateral primary osteoarthritis, left hip   2. Chronic pain of right knee     Plan: Per his wishes I did place a steroid injection in his right knee and he tolerated well.  He is can watch the effect it has on his blood glucose.  He will call our office around mid November to be scheduled for hopefully an intra-articular steroid injection in his left hip under direct fluoroscopy by Dr. Ernestina Patches around the end of November or really early December.  All question concerns were answered and addressed.  Follow-Up Instructions: Return if symptoms worsen or fail to improve.   Orders:  Orders Placed This Encounter  Procedures  . Large Joint Inj   No orders of the defined types were placed in this encounter.     Procedures: Large Joint Inj: R knee on 05/25/2018 2:33 PM Indications: diagnostic evaluation and pain Details: 22 G 1.5 in needle, superolateral approach  Arthrogram: No  Medications: 3 mL lidocaine 1 %; 40 mg methylPREDNISolone acetate 40 MG/ML Outcome: tolerated well, no immediate complications Procedure, treatment alternatives, risks and benefits explained, specific risks discussed. Consent was given by the patient. Immediately prior to procedure a time out was called to verify the correct patient, procedure, equipment, support staff and site/side marked as required. Patient was prepped and draped in the usual sterile fashion.       Clinical Data: No additional findings.   Subjective: Chief Complaint  Patient presents with  . Left Hip - Pain  The patient is well-known to me.  He has a history of a right total hip arthroplasty done 20 months ago.  His left hip has known severe  osteoarthritis in it.  He had a steroid injection under direct fluoroscopy by Dr. Ernestina Patches in the left hip toward the end of June of this year.  He said that is wearing off now.  He would like to have steroid injection in his right knee today due to pain in the morning.  He still not interested in any type of surgery.  He knows that he cannot have a steroid injection in that left hip again told toward the end of November versus early December.  He is a diabetic but he does not report any increase in blood glucose after the injections.  He does travel a lot this whole summer.  HPI  Review of Systems He currently denies any headache, chest pain, shortness of breath, fever, chills, nausea, vomiting.  Objective: Vital Signs: There were no vitals taken for this visit.  Physical Exam He is alert and oriented x3 and in no acute distress Ortho Exam Examination of his left hip shows pain with internal and external rotation.  Examination of his right knee shows no effusion with slight varus malalignment and full range of motion. Specialty Comments:  No specialty comments available.  Imaging: No results found.   PMFS History: Patient Active Problem List   Diagnosis Date Noted  . Unilateral primary osteoarthritis, left hip 06/25/2017  . Unilateral primary osteoarthritis, right hip 10/08/2016  . Status post  total replacement of right hip 10/08/2016   Past Medical History:  Diagnosis Date  . Arthritis    "hips" (10/09/2016)  . GERD (gastroesophageal reflux disease)   . High cholesterol   . Hypertension   . Type II diabetes mellitus ([facility])     History reviewed. No pertinent family history.  Past Surgical History:  Procedure Laterality Date  . COLONOSCOPY    . JOINT REPLACEMENT    . TOTAL HIP ARTHROPLASTY Right 10/08/2016   Procedure: RIGHT TOTAL HIP ARTHROPLASTY ANTERIOR APPROACH;  Surgeon: Mcarthur Rossetti, MD;  Location: [facility];  Service: Orthopedics;  Laterality: Right;   Social  History   Occupational History  . Not on file  Tobacco Use  . Smoking status: Former Smoker    Years: 40.00    Types: Cigarettes  . Smokeless tobacco: Never Used  . Tobacco comment: 10/09/2016 "stopped smoking in the mid 1990s; only smoked on the weekends"  Substance and Sexual Activity  . Alcohol use: Yes    Alcohol/week: 4.0 standard drinks    Types: 4 Shots of liquor per week  . Drug use: No  . Sexual activity: Yes

## 2018-06-23 DIAGNOSIS — M179 Osteoarthritis of knee, unspecified: Secondary | ICD-10-CM | POA: Diagnosis not present

## 2018-06-23 DIAGNOSIS — Z0000 Encounter for general adult medical examination without abnormal findings: Secondary | ICD-10-CM | POA: Diagnosis not present

## 2018-06-23 DIAGNOSIS — M169 Osteoarthritis of hip, unspecified: Secondary | ICD-10-CM | POA: Diagnosis not present

## 2018-06-23 DIAGNOSIS — Z86010 Personal history of colonic polyps: Secondary | ICD-10-CM | POA: Diagnosis not present

## 2018-06-23 DIAGNOSIS — E11319 Type 2 diabetes mellitus with unspecified diabetic retinopathy without macular edema: Secondary | ICD-10-CM | POA: Diagnosis not present

## 2018-06-23 DIAGNOSIS — K219 Gastro-esophageal reflux disease without esophagitis: Secondary | ICD-10-CM | POA: Diagnosis not present

## 2018-06-23 DIAGNOSIS — E782 Mixed hyperlipidemia: Secondary | ICD-10-CM | POA: Diagnosis not present

## 2018-06-23 DIAGNOSIS — I10 Essential (primary) hypertension: Secondary | ICD-10-CM | POA: Diagnosis not present

## 2018-06-23 DIAGNOSIS — Z23 Encounter for immunization: Secondary | ICD-10-CM | POA: Diagnosis not present

## 2018-06-23 DIAGNOSIS — B351 Tinea unguium: Secondary | ICD-10-CM | POA: Diagnosis not present

## 2018-06-23 DIAGNOSIS — R809 Proteinuria, unspecified: Secondary | ICD-10-CM | POA: Diagnosis not present

## 2018-06-23 DIAGNOSIS — E1129 Type 2 diabetes mellitus with other diabetic kidney complication: Secondary | ICD-10-CM | POA: Diagnosis not present

## 2018-06-25 DIAGNOSIS — I10 Essential (primary) hypertension: Secondary | ICD-10-CM | POA: Diagnosis not present

## 2018-06-25 DIAGNOSIS — E1129 Type 2 diabetes mellitus with other diabetic kidney complication: Secondary | ICD-10-CM | POA: Diagnosis not present

## 2018-06-25 DIAGNOSIS — H35033 Hypertensive retinopathy, bilateral: Secondary | ICD-10-CM | POA: Diagnosis not present

## 2018-06-25 DIAGNOSIS — Z23 Encounter for immunization: Secondary | ICD-10-CM | POA: Diagnosis not present

## 2018-06-25 DIAGNOSIS — Z0000 Encounter for general adult medical examination without abnormal findings: Secondary | ICD-10-CM | POA: Diagnosis not present

## 2018-06-25 DIAGNOSIS — Z1389 Encounter for screening for other disorder: Secondary | ICD-10-CM | POA: Diagnosis not present

## 2018-06-25 DIAGNOSIS — R809 Proteinuria, unspecified: Secondary | ICD-10-CM | POA: Diagnosis not present

## 2018-06-25 DIAGNOSIS — H9190 Unspecified hearing loss, unspecified ear: Secondary | ICD-10-CM | POA: Diagnosis not present

## 2018-06-25 DIAGNOSIS — N400 Enlarged prostate without lower urinary tract symptoms: Secondary | ICD-10-CM | POA: Diagnosis not present

## 2018-06-25 DIAGNOSIS — E782 Mixed hyperlipidemia: Secondary | ICD-10-CM | POA: Diagnosis not present

## 2018-06-25 DIAGNOSIS — K219 Gastro-esophageal reflux disease without esophagitis: Secondary | ICD-10-CM | POA: Diagnosis not present

## 2018-06-25 DIAGNOSIS — M169 Osteoarthritis of hip, unspecified: Secondary | ICD-10-CM | POA: Diagnosis not present

## 2018-07-13 ENCOUNTER — Encounter (INDEPENDENT_AMBULATORY_CARE_PROVIDER_SITE_OTHER): Payer: Self-pay | Admitting: Physical Medicine and Rehabilitation

## 2018-07-13 ENCOUNTER — Ambulatory Visit (INDEPENDENT_AMBULATORY_CARE_PROVIDER_SITE_OTHER): Payer: Self-pay

## 2018-07-13 ENCOUNTER — Ambulatory Visit (INDEPENDENT_AMBULATORY_CARE_PROVIDER_SITE_OTHER): Payer: Medicare Other | Admitting: Physical Medicine and Rehabilitation

## 2018-07-13 DIAGNOSIS — M25552 Pain in left hip: Secondary | ICD-10-CM | POA: Diagnosis not present

## 2018-07-13 MED ORDER — TRIAMCINOLONE ACETONIDE 40 MG/ML IJ SUSP
80.0000 mg | INTRAMUSCULAR | Status: AC | PRN
  Administered 2018-07-13: 80 mg via INTRA_ARTICULAR

## 2018-07-13 MED ORDER — BUPIVACAINE HCL 0.5 % IJ SOLN
3.0000 mL | INTRAMUSCULAR | Status: AC | PRN
  Administered 2018-07-13: 3 mL via INTRA_ARTICULAR

## 2018-07-13 NOTE — Patient Instructions (Signed)

## 2018-07-13 NOTE — Progress Notes (Signed)
   Alex Carlson - 77 y.o. male MRN [id number]  Date of birth: 01-Jun-1941  Office Visit Note: Visit Date: 07/13/2018 PCP: Cari Caraway, MD Referred by: Cari Caraway, MD  Subjective: Chief Complaint  Patient presents with  . Left Hip - Pain   HPI:  Alex Carlson is a 77 y.o. male who comes in today Plan repeat left intra-articular hip injection with fluoroscopic guidance.  Prior injection gave him 95% relief for about 4 months.  Last injection was in June.  He has worsening hip and groin pain on the left.  More posterior hip and groin.  He has prior right total hip arthroplasty.  ROS Otherwise per HPI.  Assessment & Plan: Visit Diagnoses:  1. Pain in left hip     Plan: No additional findings.   Meds & Orders: No orders of the defined types were placed in this encounter.   Orders Placed This Encounter  Procedures  . Large Joint Inj: L hip joint  . XR C-ARM NO REPORT    Follow-up: No follow-ups on file.   Procedures: Large Joint Inj: L hip joint on 07/13/2018 10:48 AM Indications: diagnostic evaluation and pain Details: 22 G 3.5 in needle, fluoroscopy-guided anterior approach  Arthrogram: No  Medications: 80 mg triamcinolone acetonide 40 MG/ML; 3 mL bupivacaine 0.5 % Outcome: tolerated well, no immediate complications  There was excellent flow of contrast producing a partial arthrogram of the hip. The patient did have relief of symptoms during the anesthetic phase of the injection. Procedure, treatment alternatives, risks and benefits explained, specific risks discussed. Consent was given by the patient. Immediately prior to procedure a time out was called to verify the correct patient, procedure, equipment, support staff and site/side marked as required. Patient was prepped and draped in the usual sterile fashion.      No notes on file   Clinical History: No specialty comments available.     Objective:  VS:  HT:    WT:   BMI:     BP:   HR: bpm  TEMP: ( )   RESP:  Physical Exam  Ortho Exam Imaging: Xr C-arm No Report  Result Date: 07/13/2018 Please see Notes tab for imaging impression.

## 2018-07-13 NOTE — Progress Notes (Signed)
 .  Numeric Pain Rating Scale and Functional Assessment Average Pain 6   In the last MONTH (on 0-10 scale) has pain interfered with the following?  1. General activity like being  able to carry out your everyday physical activities such as walking, climbing stairs, carrying groceries, or moving a chair?  Rating(4)    -Dye Allergies.  

## 2018-09-01 DIAGNOSIS — H903 Sensorineural hearing loss, bilateral: Secondary | ICD-10-CM | POA: Diagnosis not present

## 2018-09-01 DIAGNOSIS — Z570 Occupational exposure to noise: Secondary | ICD-10-CM | POA: Diagnosis not present

## 2018-09-02 ENCOUNTER — Encounter (INDEPENDENT_AMBULATORY_CARE_PROVIDER_SITE_OTHER): Payer: Self-pay | Admitting: Orthopaedic Surgery

## 2018-09-02 ENCOUNTER — Ambulatory Visit (INDEPENDENT_AMBULATORY_CARE_PROVIDER_SITE_OTHER): Payer: Medicare Other

## 2018-09-02 ENCOUNTER — Ambulatory Visit (INDEPENDENT_AMBULATORY_CARE_PROVIDER_SITE_OTHER): Payer: Medicare Other | Admitting: Orthopaedic Surgery

## 2018-09-02 DIAGNOSIS — M1711 Unilateral primary osteoarthritis, right knee: Secondary | ICD-10-CM

## 2018-09-02 DIAGNOSIS — M79604 Pain in right leg: Secondary | ICD-10-CM

## 2018-09-02 DIAGNOSIS — M1612 Unilateral primary osteoarthritis, left hip: Secondary | ICD-10-CM | POA: Diagnosis not present

## 2018-09-02 NOTE — Progress Notes (Signed)
Office Visit Note   Patient: Alex Carlson           Date of Birth: November 04, 1940           MRN: [id number] Visit Date: 09/02/2018              Requested by: Cari Caraway, [place], [place] [address] PCP: Cari Caraway, MD   Assessment & Plan: Visit Diagnoses:  1. Pain in right leg   2. Unilateral primary osteoarthritis, right knee   3. Unilateral primary osteoarthritis, left hip     Plan: We will have him go to physical therapy for quad strengthening also for gastrocsoleus stretching.  Have him follow-up in 6 weeks check his response to this therapy.  He may require one or both knees to be injected with cortisone at that time.  Follow-Up Instructions: Return in about 6 weeks (around 10/14/2018).   Orders:  Orders Placed This Encounter  Procedures  . XR Knee 1-2 Views Right  . XR Tibia/Fibula Right   No orders of the defined types were placed in this encounter.     Procedures: No procedures performed   Clinical Data: No additional findings.   Subjective: Chief Complaint  Patient presents with  . Left Leg - Pain  . Right Leg - Pain    HPI Alex Carlson comes in today describing bilateral lower leg pain after walking a lot pain is not in the calf region.  Pain is lateral aspect primarily in the right leg but some to the left leg.  He states if he walks for too long he just has to sit down.  He has had no known injury.  He denies any swelling of either leg.  Patient has known osteoarthritis of both knees.  He does state with the last cortisone injection in the right knee that some of his symptoms improved.  He denies any back pain denies any numbness tingling down either leg.  Does not currently smoke does have a remote history of smoking. Review of Systems Please see HPI otherwise negative or noncontributory.  Objective: Vital Signs: There were no vitals taken for this visit.  Physical Exam General: Well-developed well-nourished male no acute  distress mood and affect appropriate Psych: Alert and oriented x3 Ortho Exam Bilateral lower extremities no rashes skin lesions ulcerations or erythema.  Bilateral knees without effusion abnormal warmth erythema.  Slight tenderness bilateral knees globally.  No instability valgus varus stressing.  Good range of motion both knees.  Calves are supple and nontender bilaterally.  He has tenderness over the anterior lateral aspect of both legs just distal to the fibular head slightly more tender on the right than the left.  Tight gastrocs bilaterally.  Dorsal pedal pulses are 2+ bilaterally and equal and symmetric.  Feet are warm and dry.  Sensation grossly intact bilateral feet light touch.  Lower extremities 5 out of 5 strength throughout against resistance negative straight leg raise bilaterally.  Deep tendon reflexes are 2+ at the knees and 1+ at the ankles and equal and symmetric. Specialty Comments:  No specialty comments available.  Imaging: Xr Knee 1-2 Views Right  Result Date: 09/02/2018 Right knee AP lateral views: No acute fractures.  Moderate medial compartmental narrowing.  Mild patellofemoral changes.  Otherwise no bony abnormalities.  Xr Tibia/fibula Right  Result Date: 09/02/2018 Right tib-fib: No acute fractures.  Medial compartmental narrowing of the knee.  No bony abnormalities otherwise.    PMFS History: Patient Active Problem  List   Diagnosis Date Noted  . Unilateral primary osteoarthritis, left hip 06/25/2017  . Unilateral primary osteoarthritis, right hip 10/08/2016  . Status post total replacement of right hip 10/08/2016   Past Medical History:  Diagnosis Date  . Arthritis    "hips" (10/09/2016)  . GERD (gastroesophageal reflux disease)   . High cholesterol   . Hypertension   . Type II diabetes mellitus ([facility])     No family history on file.  Past Surgical History:  Procedure Laterality Date  . COLONOSCOPY    . JOINT REPLACEMENT    . TOTAL HIP ARTHROPLASTY Right  10/08/2016   Procedure: RIGHT TOTAL HIP ARTHROPLASTY ANTERIOR APPROACH;  Surgeon: Mcarthur Rossetti, MD;  Location: [facility];  Service: Orthopedics;  Laterality: Right;   Social History   Occupational History  . Not on file  Tobacco Use  . Smoking status: Former Smoker    Years: 40.00    Types: Cigarettes  . Smokeless tobacco: Never Used  . Tobacco comment: 10/09/2016 "stopped smoking in the mid 1990s; only smoked on the weekends"  Substance and Sexual Activity  . Alcohol use: Yes    Alcohol/week: 4.0 standard drinks    Types: 4 Shots of liquor per week  . Drug use: No  . Sexual activity: Yes

## 2018-09-21 DIAGNOSIS — M6281 Muscle weakness (generalized): Secondary | ICD-10-CM | POA: Diagnosis not present

## 2018-09-21 DIAGNOSIS — M79661 Pain in right lower leg: Secondary | ICD-10-CM | POA: Diagnosis not present

## 2018-09-21 DIAGNOSIS — M79662 Pain in left lower leg: Secondary | ICD-10-CM | POA: Diagnosis not present

## 2018-09-21 DIAGNOSIS — R262 Difficulty in walking, not elsewhere classified: Secondary | ICD-10-CM | POA: Diagnosis not present

## 2018-09-25 DIAGNOSIS — M6281 Muscle weakness (generalized): Secondary | ICD-10-CM | POA: Diagnosis not present

## 2018-09-25 DIAGNOSIS — M79662 Pain in left lower leg: Secondary | ICD-10-CM | POA: Diagnosis not present

## 2018-09-25 DIAGNOSIS — R262 Difficulty in walking, not elsewhere classified: Secondary | ICD-10-CM | POA: Diagnosis not present

## 2018-09-25 DIAGNOSIS — M79661 Pain in right lower leg: Secondary | ICD-10-CM | POA: Diagnosis not present

## 2018-09-30 DIAGNOSIS — M79661 Pain in right lower leg: Secondary | ICD-10-CM | POA: Diagnosis not present

## 2018-09-30 DIAGNOSIS — R262 Difficulty in walking, not elsewhere classified: Secondary | ICD-10-CM | POA: Diagnosis not present

## 2018-09-30 DIAGNOSIS — M79662 Pain in left lower leg: Secondary | ICD-10-CM | POA: Diagnosis not present

## 2018-09-30 DIAGNOSIS — M6281 Muscle weakness (generalized): Secondary | ICD-10-CM | POA: Diagnosis not present

## 2018-10-08 DIAGNOSIS — M6281 Muscle weakness (generalized): Secondary | ICD-10-CM | POA: Diagnosis not present

## 2018-10-08 DIAGNOSIS — M79661 Pain in right lower leg: Secondary | ICD-10-CM | POA: Diagnosis not present

## 2018-10-08 DIAGNOSIS — R262 Difficulty in walking, not elsewhere classified: Secondary | ICD-10-CM | POA: Diagnosis not present

## 2018-10-08 DIAGNOSIS — M79662 Pain in left lower leg: Secondary | ICD-10-CM | POA: Diagnosis not present

## 2018-10-14 DIAGNOSIS — R262 Difficulty in walking, not elsewhere classified: Secondary | ICD-10-CM | POA: Diagnosis not present

## 2018-10-14 DIAGNOSIS — M79662 Pain in left lower leg: Secondary | ICD-10-CM | POA: Diagnosis not present

## 2018-10-14 DIAGNOSIS — M6281 Muscle weakness (generalized): Secondary | ICD-10-CM | POA: Diagnosis not present

## 2018-10-14 DIAGNOSIS — M79661 Pain in right lower leg: Secondary | ICD-10-CM | POA: Diagnosis not present

## 2018-10-19 DIAGNOSIS — Z86010 Personal history of colonic polyps: Secondary | ICD-10-CM | POA: Diagnosis not present

## 2018-10-19 DIAGNOSIS — R1310 Dysphagia, unspecified: Secondary | ICD-10-CM | POA: Diagnosis not present

## 2018-10-19 DIAGNOSIS — K219 Gastro-esophageal reflux disease without esophagitis: Secondary | ICD-10-CM | POA: Diagnosis not present

## 2018-10-22 DIAGNOSIS — M79662 Pain in left lower leg: Secondary | ICD-10-CM | POA: Diagnosis not present

## 2018-10-22 DIAGNOSIS — R262 Difficulty in walking, not elsewhere classified: Secondary | ICD-10-CM | POA: Diagnosis not present

## 2018-10-22 DIAGNOSIS — M79661 Pain in right lower leg: Secondary | ICD-10-CM | POA: Diagnosis not present

## 2018-10-22 DIAGNOSIS — M6281 Muscle weakness (generalized): Secondary | ICD-10-CM | POA: Diagnosis not present

## 2018-10-30 DIAGNOSIS — R262 Difficulty in walking, not elsewhere classified: Secondary | ICD-10-CM | POA: Diagnosis not present

## 2018-10-30 DIAGNOSIS — M79661 Pain in right lower leg: Secondary | ICD-10-CM | POA: Diagnosis not present

## 2018-10-30 DIAGNOSIS — M6281 Muscle weakness (generalized): Secondary | ICD-10-CM | POA: Diagnosis not present

## 2018-10-30 DIAGNOSIS — M79662 Pain in left lower leg: Secondary | ICD-10-CM | POA: Diagnosis not present

## 2018-11-02 DIAGNOSIS — N401 Enlarged prostate with lower urinary tract symptoms: Secondary | ICD-10-CM | POA: Diagnosis not present

## 2018-11-02 DIAGNOSIS — I739 Peripheral vascular disease, unspecified: Secondary | ICD-10-CM | POA: Diagnosis not present

## 2018-11-02 DIAGNOSIS — N138 Other obstructive and reflux uropathy: Secondary | ICD-10-CM | POA: Diagnosis not present

## 2018-11-04 ENCOUNTER — Other Ambulatory Visit: Payer: Self-pay | Admitting: Family Medicine

## 2018-11-04 DIAGNOSIS — I739 Peripheral vascular disease, unspecified: Secondary | ICD-10-CM

## 2018-11-10 ENCOUNTER — Ambulatory Visit
Admission: RE | Admit: 2018-11-10 | Discharge: 2018-11-10 | Disposition: A | Discharge status: Home / Self Care | Payer: Medicare Other | Source: Ambulatory Visit | Attending: Family Medicine | Admitting: Family Medicine

## 2018-11-10 ENCOUNTER — Other Ambulatory Visit: Payer: Self-pay

## 2018-11-10 DIAGNOSIS — I739 Peripheral vascular disease, unspecified: Secondary | ICD-10-CM | POA: Diagnosis not present

## 2018-12-21 DIAGNOSIS — H35033 Hypertensive retinopathy, bilateral: Secondary | ICD-10-CM | POA: Diagnosis not present

## 2018-12-21 DIAGNOSIS — I10 Essential (primary) hypertension: Secondary | ICD-10-CM | POA: Diagnosis not present

## 2018-12-21 DIAGNOSIS — N400 Enlarged prostate without lower urinary tract symptoms: Secondary | ICD-10-CM | POA: Diagnosis not present

## 2018-12-21 DIAGNOSIS — K219 Gastro-esophageal reflux disease without esophagitis: Secondary | ICD-10-CM | POA: Diagnosis not present

## 2018-12-21 DIAGNOSIS — R809 Proteinuria, unspecified: Secondary | ICD-10-CM | POA: Diagnosis not present

## 2018-12-21 DIAGNOSIS — Z23 Encounter for immunization: Secondary | ICD-10-CM | POA: Diagnosis not present

## 2018-12-21 DIAGNOSIS — E782 Mixed hyperlipidemia: Secondary | ICD-10-CM | POA: Diagnosis not present

## 2018-12-21 DIAGNOSIS — Z79899 Other long term (current) drug therapy: Secondary | ICD-10-CM | POA: Diagnosis not present

## 2018-12-21 DIAGNOSIS — M169 Osteoarthritis of hip, unspecified: Secondary | ICD-10-CM | POA: Diagnosis not present

## 2018-12-21 DIAGNOSIS — E1129 Type 2 diabetes mellitus with other diabetic kidney complication: Secondary | ICD-10-CM | POA: Diagnosis not present

## 2018-12-21 DIAGNOSIS — Z0000 Encounter for general adult medical examination without abnormal findings: Secondary | ICD-10-CM | POA: Diagnosis not present

## 2018-12-21 DIAGNOSIS — H9190 Unspecified hearing loss, unspecified ear: Secondary | ICD-10-CM | POA: Diagnosis not present

## 2018-12-24 DIAGNOSIS — N401 Enlarged prostate with lower urinary tract symptoms: Secondary | ICD-10-CM | POA: Diagnosis not present

## 2018-12-24 DIAGNOSIS — K219 Gastro-esophageal reflux disease without esophagitis: Secondary | ICD-10-CM | POA: Diagnosis not present

## 2018-12-24 DIAGNOSIS — E11319 Type 2 diabetes mellitus with unspecified diabetic retinopathy without macular edema: Secondary | ICD-10-CM | POA: Diagnosis not present

## 2018-12-24 DIAGNOSIS — M179 Osteoarthritis of knee, unspecified: Secondary | ICD-10-CM | POA: Diagnosis not present

## 2018-12-24 DIAGNOSIS — E782 Mixed hyperlipidemia: Secondary | ICD-10-CM | POA: Diagnosis not present

## 2018-12-24 DIAGNOSIS — M169 Osteoarthritis of hip, unspecified: Secondary | ICD-10-CM | POA: Diagnosis not present

## 2018-12-24 DIAGNOSIS — I10 Essential (primary) hypertension: Secondary | ICD-10-CM | POA: Diagnosis not present

## 2018-12-24 DIAGNOSIS — H9190 Unspecified hearing loss, unspecified ear: Secondary | ICD-10-CM | POA: Diagnosis not present

## 2019-01-18 ENCOUNTER — Ambulatory Visit: Payer: Medicare Other | Admitting: Orthopaedic Surgery

## 2019-01-26 ENCOUNTER — Ambulatory Visit (INDEPENDENT_AMBULATORY_CARE_PROVIDER_SITE_OTHER): Payer: Medicare Other | Admitting: Orthopaedic Surgery

## 2019-01-26 ENCOUNTER — Encounter: Payer: Self-pay | Admitting: Orthopaedic Surgery

## 2019-01-26 ENCOUNTER — Other Ambulatory Visit: Payer: Self-pay

## 2019-01-26 DIAGNOSIS — M1711 Unilateral primary osteoarthritis, right knee: Secondary | ICD-10-CM | POA: Diagnosis not present

## 2019-01-26 DIAGNOSIS — M1712 Unilateral primary osteoarthritis, left knee: Secondary | ICD-10-CM

## 2019-01-26 MED ORDER — METHYLPREDNISOLONE ACETATE 40 MG/ML IJ SUSP
40.0000 mg | INTRAMUSCULAR | Status: AC | PRN
  Administered 2019-01-26: 40 mg via INTRA_ARTICULAR

## 2019-01-26 MED ORDER — LIDOCAINE HCL 1 % IJ SOLN
0.5000 mL | INTRAMUSCULAR | Status: AC | PRN
  Administered 2019-01-26: .5 mL

## 2019-01-26 NOTE — Progress Notes (Signed)
   Procedure Note  Patient: Alex Carlson             Date of Birth: 08-31-1940           MRN: [id number]             Visit Date: 01/26/2019 HPI: Mr. Hass comes in today requesting injections in both knees.  He states that he has had injections in both knees before and it does not really affect his diabetes.  His last hemoglobin A1c was 6.9.  Last injection his right knee 05/25/2018.  He had no new injury to either knee.  Feels like her left leg gives way with him at times but he feels is mostly due to his hip.  He has known osteoarthritis of left hip and is considering left hip surgery sometime late this winter.  Review of systems: No fevers or chills.  Otherwise see HPI.  Physical exam: Bilateral knees good range of motion with no abnormal warmth erythema or effusion. Procedures: Visit Diagnoses: Unilateral primary osteoarthritis, right knee  Unilateral primary osteoarthritis, left knee  Large Joint Inj: bilateral knee on 01/26/2019 10:07 AM Indications: pain Details: 22 G 1.5 in needle, anterolateral approach  Arthrogram: No  Medications (Right): 0.5 mL lidocaine 1 %; 40 mg methylPREDNISolone acetate 40 MG/ML Medications (Left): 0.5 mL lidocaine 1 %; 40 mg methylPREDNISolone acetate 40 MG/ML Outcome: tolerated well, no immediate complications Procedure, treatment alternatives, risks and benefits explained, specific risks discussed. Consent was given by the patient. Immediately prior to procedure a time out was called to verify the correct patient, procedure, equipment, support staff and site/side marked as required. Patient was prepped and draped in the usual sterile fashion.    Plan: He will continue to work on strengthening both knees.  He will see [place] back on as-needed basis.  Over the next few days he needs to monitor his glucose levels closely suggested he get checked hemorrhoids twice daily.  Questions were encouraged and answered at length.

## 2019-01-27 ENCOUNTER — Telehealth: Payer: Self-pay | Admitting: Physical Medicine and Rehabilitation

## 2019-01-27 NOTE — Telephone Encounter (Signed)
Yeah would wait two weeks

## 2019-01-28 NOTE — Telephone Encounter (Signed)
Scheduled for 6/25 at 0900.

## 2019-02-11 ENCOUNTER — Ambulatory Visit (INDEPENDENT_AMBULATORY_CARE_PROVIDER_SITE_OTHER): Payer: Medicare Other | Admitting: Physical Medicine and Rehabilitation

## 2019-02-11 ENCOUNTER — Other Ambulatory Visit: Payer: Self-pay

## 2019-02-11 ENCOUNTER — Ambulatory Visit: Payer: Self-pay

## 2019-02-11 ENCOUNTER — Encounter: Payer: Self-pay | Admitting: Physical Medicine and Rehabilitation

## 2019-02-11 DIAGNOSIS — M25552 Pain in left hip: Secondary | ICD-10-CM | POA: Diagnosis not present

## 2019-02-11 DIAGNOSIS — M1612 Unilateral primary osteoarthritis, left hip: Secondary | ICD-10-CM | POA: Diagnosis not present

## 2019-02-11 MED ORDER — TRIAMCINOLONE ACETONIDE 40 MG/ML IJ SUSP
80.0000 mg | INTRAMUSCULAR | Status: AC | PRN
  Administered 2019-02-11: 80 mg via INTRA_ARTICULAR

## 2019-02-11 MED ORDER — BUPIVACAINE HCL 0.25 % IJ SOLN
4.0000 mL | INTRAMUSCULAR | Status: AC | PRN
  Administered 2019-02-11: 09:00:00 4 mL via INTRA_ARTICULAR

## 2019-02-11 NOTE — Progress Notes (Signed)
   Alex Carlson - 78 y.o. male MRN [id number]  Date of birth: 1941/01/27  Office Visit Note: Visit Date: 02/11/2019 PCP: Cari Caraway, MD Referred by: Cari Caraway, MD  Subjective: Chief Complaint  Patient presents with  . Left Hip - Pain   HPI:  Alex Carlson is a 78 y.o. male who comes in today At the request of Dr. Jean Rosenthal for diagnostic medically therapeutic left intra-articular hip injection fluoroscopic guidance.  Patient is having left hip and groin pain consistent with hip pathology.  He has pretty much end-stage hip osteoarthritis on the left.  He has had diagnostic anesthetic arthrogram performed in June of last year that was very beneficial for him.  He had a prior injection in November that did well as well.  Has had no new trauma or any other issues will repeat the injection today.  He is going to follow-up with Dr. Ninfa Linden for potential replacement this year.  ROS Otherwise per HPI.  Assessment & Plan: Visit Diagnoses:  1. Pain in left hip   2. Unilateral primary osteoarthritis, left hip     Plan: No additional findings.   Meds & Orders: No orders of the defined types were placed in this encounter.   Orders Placed This Encounter  Procedures  . Large Joint Inj  . XR C-ARM NO REPORT    Follow-up: Return if symptoms worsen or fail to improve, for Jean Rosenthal, M.D..   Procedures: Large Joint Inj on 02/11/2019 9:13 AM Indications: diagnostic evaluation and pain Details: 22 G 3.5 in needle, fluoroscopy-guided anterior approach  Arthrogram: No  Medications: 80 mg triamcinolone acetonide 40 MG/ML; 4 mL bupivacaine 0.25 % Outcome: tolerated well, no immediate complications  There was excellent flow of contrast producing a partial arthrogram of the hip. The patient did have relief of symptoms during the anesthetic phase of the injection. Procedure, treatment alternatives, risks and benefits explained, specific risks discussed. Consent was  given by the patient. Immediately prior to procedure a time out was called to verify the correct patient, procedure, equipment, support staff and site/side marked as required. Patient was prepped and draped in the usual sterile fashion.      No notes on file   Clinical History: No specialty comments available.     Objective:  VS:  HT:    WT:   BMI:     BP:   HR: bpm  TEMP: ( )  RESP:  Physical Exam  Ortho Exam Imaging: Xr C-arm No Report  Result Date: 02/11/2019 Please see Notes tab for imaging impression.

## 2019-02-11 NOTE — Progress Notes (Signed)
Numeric Pain Rating Scale and Functional Assessment Average Pain 3   In the last MONTH (on 0-10 scale) has pain interfered with the following?  1. General activity like being  able to carry out your everyday physical activities such as walking, climbing stairs, carrying groceries, or moving a chair?  Rating(10) when going up step, unable to bend at hip.   Previous injections have been helpful  +Driver, -BT, -Dye Allergies.

## 2019-03-10 DIAGNOSIS — Z961 Presence of intraocular lens: Secondary | ICD-10-CM | POA: Diagnosis not present

## 2019-03-10 DIAGNOSIS — H35033 Hypertensive retinopathy, bilateral: Secondary | ICD-10-CM | POA: Diagnosis not present

## 2019-03-10 DIAGNOSIS — H26493 Other secondary cataract, bilateral: Secondary | ICD-10-CM | POA: Diagnosis not present

## 2019-03-10 DIAGNOSIS — E119 Type 2 diabetes mellitus without complications: Secondary | ICD-10-CM | POA: Diagnosis not present

## 2019-04-23 DIAGNOSIS — Z20828 Contact with and (suspected) exposure to other viral communicable diseases: Secondary | ICD-10-CM | POA: Diagnosis not present

## 2019-04-28 DIAGNOSIS — M76812 Anterior tibial syndrome, left leg: Secondary | ICD-10-CM | POA: Diagnosis not present

## 2019-04-28 DIAGNOSIS — B351 Tinea unguium: Secondary | ICD-10-CM | POA: Diagnosis not present

## 2019-04-28 DIAGNOSIS — R2689 Other abnormalities of gait and mobility: Secondary | ICD-10-CM | POA: Diagnosis not present

## 2019-04-28 DIAGNOSIS — M2170 Unequal limb length (acquired), unspecified site: Secondary | ICD-10-CM | POA: Diagnosis not present

## 2019-04-28 DIAGNOSIS — E139 Other specified diabetes mellitus without complications: Secondary | ICD-10-CM | POA: Diagnosis not present

## 2019-05-05 DIAGNOSIS — B351 Tinea unguium: Secondary | ICD-10-CM | POA: Diagnosis not present

## 2019-05-11 DIAGNOSIS — Z23 Encounter for immunization: Secondary | ICD-10-CM | POA: Diagnosis not present

## 2019-05-19 DIAGNOSIS — M2170 Unequal limb length (acquired), unspecified site: Secondary | ICD-10-CM | POA: Diagnosis not present

## 2019-05-19 DIAGNOSIS — E139 Other specified diabetes mellitus without complications: Secondary | ICD-10-CM | POA: Diagnosis not present

## 2019-05-19 DIAGNOSIS — M76812 Anterior tibial syndrome, left leg: Secondary | ICD-10-CM | POA: Diagnosis not present

## 2019-05-19 DIAGNOSIS — M76811 Anterior tibial syndrome, right leg: Secondary | ICD-10-CM | POA: Diagnosis not present

## 2019-06-30 ENCOUNTER — Ambulatory Visit (INDEPENDENT_AMBULATORY_CARE_PROVIDER_SITE_OTHER): Payer: Medicare Other | Admitting: Orthopaedic Surgery

## 2019-06-30 ENCOUNTER — Encounter: Payer: Self-pay | Admitting: Orthopaedic Surgery

## 2019-06-30 ENCOUNTER — Ambulatory Visit (INDEPENDENT_AMBULATORY_CARE_PROVIDER_SITE_OTHER): Payer: Medicare Other

## 2019-06-30 ENCOUNTER — Other Ambulatory Visit: Payer: Self-pay

## 2019-06-30 DIAGNOSIS — M1612 Unilateral primary osteoarthritis, left hip: Secondary | ICD-10-CM | POA: Diagnosis not present

## 2019-06-30 DIAGNOSIS — M25552 Pain in left hip: Secondary | ICD-10-CM

## 2019-06-30 NOTE — Progress Notes (Signed)
Office Visit Note   Patient: Alex Carlson           Date of Birth: 1940-11-07           MRN: [id number] Visit Date: 06/30/2019              Requested by: Cari Caraway, [place],  [place] [address] PCP: Cari Caraway, MD   Assessment & Plan: Visit Diagnoses:  1. Pain in left hip   2. Unilateral primary osteoarthritis, left hip     Plan: At this point he has tried and failed all forms of conservative treatment to the point he does wish to proceed with total hip arthroplasty on the left side and I agree with this as well.  Given his clinical exam findings and x-ray findings as well as the failure of conservative treatment for over a year this is medically warranted.  Having had his right hip replaced he is fully aware the risk and benefits of surgery.  He understands what his interoperative and postoperative course involved.  He would like to have the surgery scheduled soon and I agree with this.  Follow-Up Instructions: Return in about 2 years (around 06/29/2021).   Orders:  Orders Placed This Encounter  Procedures  . XR HIP UNILAT W OR W/O PELVIS 2-3 VIEWS LEFT   No orders of the defined types were placed in this encounter.     Procedures: No procedures performed   Clinical Data: No additional findings.   Subjective: Chief Complaint  Patient presents with  . Left Hip - Pain  The patient is well-known to me.  He is 2 years out from a right total hip arthroplasty.  He does have severe well-documented arthritis involving his left hip.  He has tried and failed intra-articular steroid injections.  The last one he had this summer lasted about 3 weeks.  He does ambulate with a cane.  He is a very active 78 year old gentleman.  His hemoglobin A1c runs below 7.  At this point his left hip pain is daily and is definitely affecting his actives the living, his quality of life and his mobility to the point he does wish proceed with total hip arthroplasty on the  left side.  He has tried and failed conservative treatment for over 12 months now.  He does ambulate using a cane.  His right operative hip from 2 years ago he said is done wonderful and is pain-free.  HPI  Review of Systems He currently denies any headache, chest pain, shortness of breath, fever, chills, nausea, vomiting  Objective: Vital Signs: There were no vitals taken for this visit.  Physical Exam He is alert and orient x3 and in no acute distress Ortho Exam Examination of his right operative hip shows that it moves smoothly with no issues at all.  His left hip has significant imitations with internal and external rotation with severe pain with rotating his hip. Specialty Comments:  No specialty comments available.  Imaging: Xr Hip Unilat W Or W/o Pelvis 2-3 Views Left  Result Date: 06/30/2019 An AP pelvis and lateral of left hip shows a well-seated total hip arthroplasty on the right side and AP view.  The left hip shows severe end-stage arthritis.  There is significant sclerotic changes in the femoral head as well as cystic changes.  There is probably superimposed osteonecrosis.  There is severe joint space narrowing as well as large periarticular osteophytes.    PMFS History: Patient Active Problem  List   Diagnosis Date Noted  . Unilateral primary osteoarthritis, left hip 06/25/2017  . Unilateral primary osteoarthritis, right hip 10/08/2016  . Status post total replacement of right hip 10/08/2016   Past Medical History:  Diagnosis Date  . Arthritis    "hips" (10/09/2016)  . GERD (gastroesophageal reflux disease)   . High cholesterol   . Hypertension   . Type II diabetes mellitus ([place])     History reviewed. No pertinent family history.  Past Surgical History:  Procedure Laterality Date  . COLONOSCOPY    . JOINT REPLACEMENT    . TOTAL HIP ARTHROPLASTY Right 10/08/2016   Procedure: RIGHT TOTAL HIP ARTHROPLASTY ANTERIOR APPROACH;  Surgeon: Mcarthur Rossetti, MD;   Location: [facility];  Service: Orthopedics;  Laterality: Right;   Social History   Occupational History  . Not on file  Tobacco Use  . Smoking status: Former Smoker    Years: 40.00    Types: Cigarettes  . Smokeless tobacco: Never Used  . Tobacco comment: 10/09/2016 "stopped smoking in the mid 1990s; only smoked on the weekends"  Substance and Sexual Activity  . Alcohol use: Yes    Alcohol/week: 4.0 standard drinks    Types: 4 Shots of liquor per week  . Drug use: No  . Sexual activity: Yes

## 2019-07-02 ENCOUNTER — Other Ambulatory Visit: Payer: Self-pay

## 2019-07-05 DIAGNOSIS — E1129 Type 2 diabetes mellitus with other diabetic kidney complication: Secondary | ICD-10-CM | POA: Diagnosis not present

## 2019-07-05 DIAGNOSIS — N401 Enlarged prostate with lower urinary tract symptoms: Secondary | ICD-10-CM | POA: Diagnosis not present

## 2019-07-05 DIAGNOSIS — R809 Proteinuria, unspecified: Secondary | ICD-10-CM | POA: Diagnosis not present

## 2019-07-05 DIAGNOSIS — Z23 Encounter for immunization: Secondary | ICD-10-CM | POA: Diagnosis not present

## 2019-07-05 DIAGNOSIS — H35033 Hypertensive retinopathy, bilateral: Secondary | ICD-10-CM | POA: Diagnosis not present

## 2019-07-05 DIAGNOSIS — E782 Mixed hyperlipidemia: Secondary | ICD-10-CM | POA: Diagnosis not present

## 2019-07-05 DIAGNOSIS — N400 Enlarged prostate without lower urinary tract symptoms: Secondary | ICD-10-CM | POA: Diagnosis not present

## 2019-07-05 DIAGNOSIS — Z79899 Other long term (current) drug therapy: Secondary | ICD-10-CM | POA: Diagnosis not present

## 2019-07-05 DIAGNOSIS — H9190 Unspecified hearing loss, unspecified ear: Secondary | ICD-10-CM | POA: Diagnosis not present

## 2019-07-05 DIAGNOSIS — K219 Gastro-esophageal reflux disease without esophagitis: Secondary | ICD-10-CM | POA: Diagnosis not present

## 2019-07-05 DIAGNOSIS — E11319 Type 2 diabetes mellitus with unspecified diabetic retinopathy without macular edema: Secondary | ICD-10-CM | POA: Diagnosis not present

## 2019-07-05 DIAGNOSIS — Z0000 Encounter for general adult medical examination without abnormal findings: Secondary | ICD-10-CM | POA: Diagnosis not present

## 2019-07-05 DIAGNOSIS — M169 Osteoarthritis of hip, unspecified: Secondary | ICD-10-CM | POA: Diagnosis not present

## 2019-07-05 DIAGNOSIS — I10 Essential (primary) hypertension: Secondary | ICD-10-CM | POA: Diagnosis not present

## 2019-07-08 DIAGNOSIS — R801 Persistent proteinuria, unspecified: Secondary | ICD-10-CM | POA: Diagnosis not present

## 2019-07-08 DIAGNOSIS — K219 Gastro-esophageal reflux disease without esophagitis: Secondary | ICD-10-CM | POA: Diagnosis not present

## 2019-07-08 DIAGNOSIS — E1129 Type 2 diabetes mellitus with other diabetic kidney complication: Secondary | ICD-10-CM | POA: Diagnosis not present

## 2019-07-08 DIAGNOSIS — E782 Mixed hyperlipidemia: Secondary | ICD-10-CM | POA: Diagnosis not present

## 2019-07-08 DIAGNOSIS — Z1389 Encounter for screening for other disorder: Secondary | ICD-10-CM | POA: Diagnosis not present

## 2019-07-08 DIAGNOSIS — H9190 Unspecified hearing loss, unspecified ear: Secondary | ICD-10-CM | POA: Diagnosis not present

## 2019-07-08 DIAGNOSIS — M169 Osteoarthritis of hip, unspecified: Secondary | ICD-10-CM | POA: Diagnosis not present

## 2019-07-08 DIAGNOSIS — Z0000 Encounter for general adult medical examination without abnormal findings: Secondary | ICD-10-CM | POA: Diagnosis not present

## 2019-07-08 DIAGNOSIS — I10 Essential (primary) hypertension: Secondary | ICD-10-CM | POA: Diagnosis not present

## 2019-07-08 DIAGNOSIS — Z79899 Other long term (current) drug therapy: Secondary | ICD-10-CM | POA: Diagnosis not present

## 2019-07-08 DIAGNOSIS — N400 Enlarged prostate without lower urinary tract symptoms: Secondary | ICD-10-CM | POA: Diagnosis not present

## 2019-07-19 DIAGNOSIS — Z23 Encounter for immunization: Secondary | ICD-10-CM | POA: Diagnosis not present

## 2019-07-23 NOTE — Patient Instructions (Addendum)
DUE TO COVID-19 ONLY ONE VISITOR IS ALLOWED TO COME WITH YOU AND STAY IN THE WAITING ROOM ONLY DURING PRE OP AND PROCEDURE DAY OF SURGERY. THE 1 VISITOR MAY VISIT WITH YOU AFTER SURGERY IN YOUR PRIVATE ROOM DURING VISITING HOURS ONLY!  YOU NEED TO HAVE A COVID 19 TEST ON___12-8___ @__  11 20 am_____, THIS TEST MUST BE DONE BEFORE SURGERY, COME  [address], [place] , [address].  ([place]) ONCE YOUR COVID TEST IS COMPLETED, PLEASE BEGIN THE QUARANTINE INSTRUCTIONS AS OUTLINED IN YOUR HANDOUT.                Alex Carlson   Your procedure is scheduled on: 12-11   Report to [facility]  Entrance   Report to admitting at 8:30AM     Call this number if you have problems the morning of surgery [phone]    Remember: Do not eat food  :After Midnight.  NO SOLID FOOD AFTER MIDNIGHT THE NIGHT PRIOR TO SURGERY. NOTHING BY MOUTH EXCEPT CLEAR LIQUIDS UNTIL 800 AM . PLEASE FINISH G2 DRINK PER SURGEON ORDER  WHICH NEEDS TO BE COMPLETED AT  800 AM.   CLEAR LIQUID DIET   Foods Allowed                                                                     Foods Excluded  Coffee and tea, regular and decaf                             liquids that you cannot  Plain Jell-O any favor except red or purple                                           see through such as: Fruit ices (not with fruit pulp)                                     milk, soups, orange juice  Iced Popsicles                                    All solid food Carbonated beverages, regular and diet                                    Cranberry, grape and apple juices Sports drinks like Gatorade Lightly seasoned clear broth or consume(fat free) Sugar, honey syrup  Sample Menu Breakfast                                Lunch                                     Supper Cranberry juice  Beef broth                            Chicken broth Jell-O                                     Grape  juice                           Apple juice Coffee or tea                        Jell-O                                      Popsicle                                                Coffee or tea                        Coffee or tea  _____________________________________________________________________       BRUSH YOUR TEETH MORNING OF SURGERY AND RINSE YOUR MOUTH OUT, NO CHEWING GUM CANDY OR MINTS.     Take these medicines the morning of surgery with A SIP OF WATER: AMLODIPINE, FINASTERIDE, LOVASTATIN  How to Manage Your Diabetes Before and After Surgery  Why is it important to control my blood sugar before and after surgery? . Improving blood sugar levels before and after surgery helps healing and can limit problems. . A way of improving blood sugar control is eating a healthy diet by: o  Eating less sugar and carbohydrates o  Increasing activity/exercise o  Talking with your doctor about reaching your blood sugar goals . High blood sugars (greater than 180 mg/dL) can raise your risk of infections and slow your recovery, so you will need to focus on controlling your diabetes during the weeks before surgery. . Make sure that the doctor who takes care of your diabetes knows about your planned surgery including the date and location.  How do I manage my blood sugar before surgery? . Check your blood sugar at least 4 times a day, starting 2 days before surgery, to make sure that the level is not too high or low. o Check your blood sugar the morning of your surgery when you wake up and every 2 hours until you get to the Short Stay unit. . If your blood sugar is less than 70 mg/dL, you will need to treat for low blood sugar: o Do not take insulin. o Treat a low blood sugar (less than 70 mg/dL) with  cup of clear juice (cranberry or apple), 4 glucose tablets, OR glucose gel. o Recheck blood sugar in 15 minutes after treatment (to make sure it is greater than 70 mg/dL). If your blood sugar  is not greater than 70 mg/dL on recheck, call [phone] for further instructions. . Report your blood sugar to the short stay nurse when you get to Short Stay.  . If you are admitted to the hospital after surgery: o Your blood sugar will be checked by the  staff and you will probably be given insulin after surgery (instead of oral diabetes medicines) to make sure you have good blood sugar levels. o The goal for blood sugar control after surgery is 80-180 mg/dL.    WHAT DO I DO ABOUT MY DIABETES MEDICATION?  Marland Kitchen Do not take oral diabetes medicines (pills) the morning of surgery.     Reviewed and Endorsed by [facility] Patient Education Committee, August 2015                               You may not have any metal on your body including hair pins and              piercings  Do not wear jewelry, make-up, lotions, powders or perfumes, deodorant                        Men may shave face and neck.   Do not bring valuables to the hospital. [place].  Contacts, dentures or bridgework may not be worn into surgery.  YOU MAY BRING A SMALL OVERNIGHT BAG              Please read over the following fact sheets you were given: _____________________________________________________________________             [facility] - Preparing for Surgery Before surgery, you can play an important role.  Because skin is not sterile, your skin needs to be as free of germs as possible.  You can reduce the number of germs on your skin by washing with CHG (chlorahexidine gluconate) soap before surgery.  CHG is an antiseptic cleaner which kills germs and bonds with the skin to continue killing germs even after washing. Please DO NOT use if you have an allergy to CHG or antibacterial soaps.  If your skin becomes reddened/irritated stop using the CHG and inform your nurse when you arrive at Short Stay. Do not shave (including legs and underarms) for at least 48  hours prior to the first CHG shower.  You may shave your face/neck. Please follow these instructions carefully:  1.  Shower with CHG Soap the night before surgery and the  morning of Surgery.  2.  If you choose to wash your hair, wash your hair first as usual with your  normal  shampoo.  3.  After you shampoo, rinse your hair and body thoroughly to remove the  shampoo.                           4.  Use CHG as you would any other liquid soap.  You can apply chg directly  to the skin and wash                       Gently with a scrungie or clean washcloth.  5.  Apply the CHG Soap to your body ONLY FROM THE NECK DOWN.   Do not use on face/ open                           Wound or open sores. Avoid contact with eyes, ears mouth and genitals (private parts).  Wash face,  Genitals (private parts) with your normal soap.             6.  Wash thoroughly, paying special attention to the area where your surgery  will be performed.  7.  Thoroughly rinse your body with warm water from the neck down.  8.  DO NOT shower/wash with your normal soap after using and rinsing off  the CHG Soap.                9.  Pat yourself dry with a clean towel.            10.  Wear clean pajamas.            11.  Place clean sheets on your bed the night of your first shower and do not  sleep with pets. Day of Surgery : Do not apply any lotions/deodorants the morning of surgery.  Please wear clean clothes to the hospital/surgery center.  FAILURE TO FOLLOW THESE INSTRUCTIONS MAY RESULT IN THE CANCELLATION OF YOUR SURGERY PATIENT SIGNATURE_________________________________  NURSE SIGNATURE__________________________________  ________________________________________________________________________

## 2019-07-23 NOTE — Progress Notes (Signed)
Dr Ninfa Linden , please place orders in epic for this patient's upcoming surgery. Their pre-op appt is on 07-27-2019 . TY

## 2019-07-26 ENCOUNTER — Other Ambulatory Visit (HOSPITAL_COMMUNITY): Payer: Self-pay | Admitting: *Deleted

## 2019-07-26 ENCOUNTER — Other Ambulatory Visit: Payer: Self-pay | Admitting: Physician Assistant

## 2019-07-27 ENCOUNTER — Other Ambulatory Visit: Payer: Self-pay

## 2019-07-27 ENCOUNTER — Other Ambulatory Visit (HOSPITAL_COMMUNITY)
Admission: RE | Admit: 2019-07-27 | Discharge: 2019-07-27 | Disposition: A | Discharge status: Home / Self Care | Payer: Medicare Other | Source: Ambulatory Visit | Attending: Surgery | Admitting: Surgery

## 2019-07-27 ENCOUNTER — Encounter (HOSPITAL_COMMUNITY): Payer: Self-pay

## 2019-07-27 ENCOUNTER — Encounter (HOSPITAL_COMMUNITY)
Admission: RE | Admit: 2019-07-27 | Discharge: 2019-07-27 | Disposition: A | Discharge status: Home / Self Care | Payer: Medicare Other | Source: Ambulatory Visit | Attending: Orthopaedic Surgery | Admitting: Orthopaedic Surgery

## 2019-07-27 DIAGNOSIS — Z01818 Encounter for other preprocedural examination: Secondary | ICD-10-CM | POA: Diagnosis not present

## 2019-07-27 DIAGNOSIS — E119 Type 2 diabetes mellitus without complications: Secondary | ICD-10-CM | POA: Insufficient documentation

## 2019-07-27 DIAGNOSIS — Z20828 Contact with and (suspected) exposure to other viral communicable diseases: Secondary | ICD-10-CM | POA: Diagnosis not present

## 2019-07-27 DIAGNOSIS — M1612 Unilateral primary osteoarthritis, left hip: Secondary | ICD-10-CM | POA: Diagnosis not present

## 2019-07-27 LAB — BASIC METABOLIC PANEL
Anion gap: 10 (ref 5–15)
BUN: 16 mg/dL (ref 8–23)
CO2: 24 mmol/L (ref 22–32)
Calcium: 9.8 mg/dL (ref 8.9–10.3)
Chloride: 105 mmol/L (ref 98–111)
Creatinine, Ser: 0.81 mg/dL (ref 0.61–1.24)
GFR calc Af Amer: >60 mL/min (ref >60)
GFR calc non Af Amer: >60 mL/min (ref >60)
Glucose, Bld: 117 mg/dL — ABNORMAL HIGH (ref 70–99)
Potassium: 4.4 mmol/L (ref 3.5–5.1)
Sodium: 139 mmol/L (ref 135–145)

## 2019-07-27 LAB — CBC
HCT: 45.7 % (ref 39.0–52.0)
Hemoglobin: 14.7 g/dL (ref 13.0–17.0)
MCH: 30.2 pg (ref 26.0–34.0)
MCHC: 32.2 g/dL (ref 30.0–36.0)
MCV: 94 fL (ref 80.0–100.0)
Platelets: 276 10*3/uL (ref 150–400)
RBC: 4.86 MIL/uL (ref 4.22–5.81)
RDW: 13.3 % (ref 11.5–15.5)
WBC: 4.5 10*3/uL (ref 4.0–10.5)
nRBC: 0 % (ref 0.0–0.2)

## 2019-07-27 LAB — SURGICAL PCR SCREEN
MRSA, PCR: NEGATIVE
Staphylococcus aureus: NEGATIVE

## 2019-07-27 LAB — GLUCOSE, CAPILLARY: Glucose-Capillary: 115 mg/dL — ABNORMAL HIGH (ref 70–99)

## 2019-07-27 NOTE — Progress Notes (Addendum)
PCP - dr Cari Caraway medical clearance 07-08-19 on chart Cardiologist - none  Chest x-ray - none EKG -07-27-2019  Stress Test - none ECHO - none Cardiac Cath - none  Sleep Study - none CPAP - none  Fasting Blood Sugar - 106, hemaglobina a1c dr Addison Lank 07-05-2019 on chart Checks Blood Sugar q day in am  Blood Thinner Instructions: Aspirin Instructions:81 mg aspirin, patient to stay on aspirin per dr Ninfa Linden Last Dose:  Anesthesia review:   Patient denies shortness of breath, fever, cough and chest pain at PAT appointment   Patient verbalized understanding of instructions that were given to them at the PAT appointment. Patient was also instructed that they will need to review over the PAT instructions again at home before surgery.

## 2019-07-28 LAB — NOVEL CORONAVIRUS, NAA (HOSP ORDER, SEND-OUT TO REF LAB; TAT 18-24 HRS): SARS-CoV-2, NAA: NOT DETECTED

## 2019-07-29 ENCOUNTER — Telehealth: Payer: Self-pay | Admitting: *Deleted

## 2019-07-29 NOTE — Care Plan (Signed)
RNCM call to patient for pre-op call prior to his upcoming Left total hip arthroplasty with Dr. Ninfa Linden on 07/30/2019. Reviewed [facility] Ortho bundle program and provided opportunity for patient to ask questions. Patient has a spouse that will be able to assist at home after discharge. He has all DME needed for post-surgery needs. He verbalized that [facility] provided his in home therapy previously for his Right THA done in 2018. Anticipate HHPT will be needed after short hospital stay. Referral provided to [facility] liaison per patient's wishes. He reports his last A1C on 07/08/19 as 6.2 with Dr. Baldomero Lamy office. Will continue to monitor for CM needs in hospital and afterward per Ortho bundle.

## 2019-07-29 NOTE — Telephone Encounter (Signed)
Ortho bundle pre-op call completed. 

## 2019-07-29 NOTE — H&P (Signed)
TOTAL HIP ADMISSION H&P  Patient is admitted for left total hip arthroplasty.  Subjective:  Chief Complaint: left hip pain  HPI: Alex Carlson, 77 y.o. male, has a history of pain and functional disability in the left hip(s) due to arthritis and patient has failed non-surgical conservative treatments for greater than 12 weeks to include NSAID's and/or analgesics, corticosteriod injections, supervised PT with diminished ADL's post treatment, use of assistive devices and activity modification.  Onset of symptoms was gradual starting 2 years ago with gradually worsening course since that time.The patient noted no past surgery on the left hip(s).  Patient currently rates pain in the left hip at 10 out of 10 with activity. Patient has night pain, worsening of pain with activity and weight bearing, trendelenberg gait, pain that interfers with activities of daily living, pain with passive range of motion and crepitus. Patient has evidence of subchondral cysts, subchondral sclerosis, periarticular osteophytes and joint space narrowing by imaging studies. This condition presents safety issues increasing the risk of falls.  There is no current active infection.  Patient Active Problem List   Diagnosis Date Noted  . Unilateral primary osteoarthritis, left hip 06/25/2017  . Unilateral primary osteoarthritis, right hip 10/08/2016  . Status post total replacement of right hip 10/08/2016   Past Medical History:  Diagnosis Date  . Arthritis    "hips" (10/09/2016)  . GERD (gastroesophageal reflux disease)   . High cholesterol   . Hypertension   . Type II diabetes mellitus ([place])     Past Surgical History:  Procedure Laterality Date  . COLONOSCOPY    . JOINT REPLACEMENT    . TOTAL HIP ARTHROPLASTY Right 10/08/2016   Procedure: RIGHT TOTAL HIP ARTHROPLASTY ANTERIOR APPROACH;  Surgeon: Mcarthur Rossetti, MD;  Location: [facility];  Service: Orthopedics;  Laterality: Right;    No current  facility-administered medications for this encounter.   Current Outpatient Medications  Medication Sig Dispense Refill Last Dose  . acetaminophen (TYLENOL ARTHRITIS PAIN) 650 MG CR tablet Take 1,300 mg by mouth every 8 (eight) hours as needed for pain.     Marland Kitchen amLODipine (NORVASC) 5 MG tablet Take 5 mg by mouth daily.      Marland Kitchen aspirin 81 MG chewable tablet Chew 1 tablet (81 mg total) by mouth 2 (two) times daily. (Patient taking differently: Chew 81 mg by mouth daily. ) 30 tablet 0   . Calcium Carbonate-Vit D-Min (CALCIUM 1200 PO) Take 1,200 mg by mouth 2 (two) times daily.     . Cholecalciferol (VITAMIN D3) 1000 units CAPS Take 1,000 Units by mouth every evening.     . finasteride (PROSCAR) 5 MG tablet Take 5 mg by mouth daily.     Marland Kitchen GLIPIZIDE XL 10 MG 24 hr tablet Take 10 mg by mouth daily with breakfast.      . lovastatin (MEVACOR) 40 MG tablet Take 40 mg by mouth daily.      . metFORMIN (GLUCOPHAGE-XR) 500 MG 24 hr tablet Take 500 mg by mouth 2 (two) times daily.     . Omega-3 Fatty Acids ([employer] FISH OIL) 1200 MG CAPS Take 1,200 mg by mouth 2 (two) times daily.     . pantoprazole (PROTONIX) 40 MG tablet Take 40 mg by mouth every evening.      Marland Kitchen amoxicillin (AMOXIL) 500 MG tablet Take 2 tabs po one hour before dental appointment and 2 tabs po six hours after dental appointmet (Patient not taking: Reported on 07/23/2019) 8 tablet 0 Not Taking  at Unknown time  . methocarbamol (ROBAXIN) 500 MG tablet Take 1 tablet (500 mg total) by mouth every 6 (six) hours as needed for muscle spasms. (Patient not taking: Reported on 07/23/2019) 60 tablet 0 Not Taking at Unknown time  . oxyCODONE-acetaminophen (ROXICET) 5-325 MG tablet Take 1-2 tablets by mouth every 4 (four) hours as needed. (Patient not taking: Reported on 07/23/2019) 60 tablet 0 Not Taking at Unknown time   Allergies  Allergen Reactions  . Lisinopril Swelling    SWELLING OF LIPS    Social History   Tobacco Use  . Smoking status: Former Smoker     Years: 40.00    Types: Cigarettes  . Smokeless tobacco: Never Used  . Tobacco comment: 10/09/2016 "stopped smoking in the mid 1990s; only smoked on the weekends"  Substance Use Topics  . Alcohol use: Yes    Alcohol/week: 4.0 standard drinks    Types: 4 Shots of liquor per week    Comment: occ    No family history on file.   Review of Systems  All other systems reviewed and are negative.   Objective:  Physical Exam  Constitutional: He is oriented to person, place, and time. He appears well-developed and well-nourished.  HENT:  Head: Normocephalic and atraumatic.  Eyes: Pupils are equal, round, and reactive to light. EOM are normal.  Cardiovascular: Normal rate and regular rhythm.  Respiratory: Effort normal and breath sounds normal.  GI: Soft. Bowel sounds are normal.  Musculoskeletal:     Cervical back: Normal range of motion and neck supple.     Left hip: Tenderness and bony tenderness present. Decreased range of motion. Decreased strength.  Neurological: He is alert and oriented to person, place, and time.  Skin: Skin is warm and dry.  Psychiatric: He has a normal mood and affect.    Vital signs in last 24 hours:    Labs:   Estimated body mass index is 25.99 kg/m as calculated from the following:   Height as of 07/27/19: 5\' 9"  (1.753 m).   Weight as of 07/27/19: 79.8 kg.   Imaging Review Plain radiographs demonstrate severe degenerative joint disease of the left hip(s). The bone quality appears to be good for age and reported activity level.      Assessment/Plan:  End stage arthritis, left hip(s)  The patient history, physical examination, clinical judgement of the provider and imaging studies are consistent with end stage degenerative joint disease of the left hip(s) and total hip arthroplasty is deemed medically necessary. The treatment options including medical management, injection therapy, arthroscopy and arthroplasty were discussed at length. The  risks and benefits of total hip arthroplasty were presented and reviewed. The risks due to aseptic loosening, infection, stiffness, dislocation/subluxation,  thromboembolic complications and other imponderables were discussed.  The patient acknowledged the explanation, agreed to proceed with the plan and consent was signed. Patient is being admitted for inpatient treatment for surgery, pain control, PT, OT, prophylactic antibiotics, VTE prophylaxis, progressive ambulation and ADL's and discharge planning.The patient is planning to be discharged home with home health services

## 2019-07-30 ENCOUNTER — Ambulatory Visit (HOSPITAL_COMMUNITY): Payer: Medicare Other

## 2019-07-30 ENCOUNTER — Ambulatory Visit (HOSPITAL_COMMUNITY): Payer: Medicare Other | Admitting: Anesthesiology

## 2019-07-30 ENCOUNTER — Encounter (HOSPITAL_COMMUNITY)
Admission: AD | Disposition: A | Discharge status: Home Health | Payer: Self-pay | Source: Home / Self Care | Attending: Orthopaedic Surgery

## 2019-07-30 ENCOUNTER — Observation Stay (HOSPITAL_COMMUNITY): Payer: Medicare Other

## 2019-07-30 ENCOUNTER — Ambulatory Visit (HOSPITAL_COMMUNITY): Payer: Medicare Other | Admitting: Physician Assistant

## 2019-07-30 ENCOUNTER — Encounter (HOSPITAL_COMMUNITY): Payer: Self-pay | Admitting: Orthopaedic Surgery

## 2019-07-30 ENCOUNTER — Other Ambulatory Visit: Payer: Self-pay

## 2019-07-30 ENCOUNTER — Inpatient Hospital Stay (HOSPITAL_COMMUNITY)
Admission: AD | Admit: 2019-07-30 | Discharge: 2019-08-01 | DRG: 470 | Disposition: A | Discharge status: Home Health | Payer: Medicare Other | Attending: Orthopaedic Surgery | Admitting: Orthopaedic Surgery

## 2019-07-30 DIAGNOSIS — Z87891 Personal history of nicotine dependence: Secondary | ICD-10-CM

## 2019-07-30 DIAGNOSIS — Z7984 Long term (current) use of oral hypoglycemic drugs: Secondary | ICD-10-CM

## 2019-07-30 DIAGNOSIS — K219 Gastro-esophageal reflux disease without esophagitis: Secondary | ICD-10-CM | POA: Diagnosis present

## 2019-07-30 DIAGNOSIS — Z96642 Presence of left artificial hip joint: Secondary | ICD-10-CM

## 2019-07-30 DIAGNOSIS — M1612 Unilateral primary osteoarthritis, left hip: Principal | ICD-10-CM | POA: Diagnosis present

## 2019-07-30 DIAGNOSIS — Z888 Allergy status to other drugs, medicaments and biological substances status: Secondary | ICD-10-CM

## 2019-07-30 DIAGNOSIS — Z96641 Presence of right artificial hip joint: Secondary | ICD-10-CM | POA: Diagnosis present

## 2019-07-30 DIAGNOSIS — I10 Essential (primary) hypertension: Secondary | ICD-10-CM | POA: Diagnosis present

## 2019-07-30 DIAGNOSIS — Z79899 Other long term (current) drug therapy: Secondary | ICD-10-CM

## 2019-07-30 DIAGNOSIS — Z20828 Contact with and (suspected) exposure to other viral communicable diseases: Secondary | ICD-10-CM | POA: Diagnosis present

## 2019-07-30 DIAGNOSIS — E119 Type 2 diabetes mellitus without complications: Secondary | ICD-10-CM | POA: Diagnosis present

## 2019-07-30 DIAGNOSIS — Z7982 Long term (current) use of aspirin: Secondary | ICD-10-CM

## 2019-07-30 DIAGNOSIS — E7800 Pure hypercholesterolemia, unspecified: Secondary | ICD-10-CM | POA: Diagnosis present

## 2019-07-30 DIAGNOSIS — Z419 Encounter for procedure for purposes other than remedying health state, unspecified: Secondary | ICD-10-CM

## 2019-07-30 HISTORY — PX: TOTAL HIP ARTHROPLASTY: SHX124

## 2019-07-30 LAB — GLUCOSE, CAPILLARY
Glucose-Capillary: 112 mg/dL — ABNORMAL HIGH (ref 70–99)
Glucose-Capillary: 105 mg/dL — ABNORMAL HIGH (ref 70–99)

## 2019-07-30 SURGERY — ARTHROPLASTY, HIP, TOTAL, ANTERIOR APPROACH
Anesthesia: Monitor Anesthesia Care | Site: Hip | Laterality: Left

## 2019-07-30 MED ORDER — VITAMIN D3 25 MCG (1000 UNIT) PO TABS
1000.0000 [IU] | ORAL_TABLET | Freq: Every evening | ORAL | Status: DC
  Administered 2019-07-31: 1000 [IU] via ORAL
  Filled 2019-07-30: qty 1

## 2019-07-30 MED ORDER — FENTANYL CITRATE (PF) 100 MCG/2ML IJ SOLN
INTRAMUSCULAR | Status: AC
  Filled 2019-07-30: qty 2

## 2019-07-30 MED ORDER — SODIUM CHLORIDE 0.9 % IV SOLN
INTRAVENOUS | Status: DC
  Administered 2019-07-30 – 2019-07-31 (×3): via INTRAVENOUS

## 2019-07-30 MED ORDER — PROPOFOL 10 MG/ML IV BOLUS
INTRAVENOUS | Status: DC | PRN
  Administered 2019-07-30 (×2): 20 ug via INTRAVENOUS
  Administered 2019-07-30: 10 ug via INTRAVENOUS

## 2019-07-30 MED ORDER — ACETAMINOPHEN 500 MG PO TABS
1000.0000 mg | ORAL_TABLET | Freq: Once | ORAL | Status: DC
  Administered 2019-07-30: 1000 mg via ORAL

## 2019-07-30 MED ORDER — PANTOPRAZOLE SODIUM 40 MG PO TBEC
40.0000 mg | DELAYED_RELEASE_TABLET | Freq: Every evening | ORAL | Status: DC
  Administered 2019-07-31: 40 mg via ORAL
  Filled 2019-07-30: qty 1

## 2019-07-30 MED ORDER — FENTANYL CITRATE (PF) 100 MCG/2ML IJ SOLN
INTRAMUSCULAR | Status: DC | PRN
  Administered 2019-07-30: 100 ug via INTRAVENOUS

## 2019-07-30 MED ORDER — ACETAMINOPHEN 325 MG PO TABS
325.0000 mg | ORAL_TABLET | Freq: Four times a day (QID) | ORAL | Status: DC | PRN
  Administered 2019-08-01: 650 mg via ORAL
  Filled 2019-07-30 (×2): qty 2

## 2019-07-30 MED ORDER — HYDROCODONE-ACETAMINOPHEN 7.5-325 MG PO TABS: 1.0000 | ORAL_TABLET | ORAL | Status: DC | PRN

## 2019-07-30 MED ORDER — DOCUSATE SODIUM 100 MG PO CAPS
100.0000 mg | ORAL_CAPSULE | Freq: Two times a day (BID) | ORAL | Status: DC
  Administered 2019-07-31 – 2019-08-01 (×4): 100 mg via ORAL
  Filled 2019-07-30 (×4): qty 1

## 2019-07-30 MED ORDER — BUPIVACAINE HCL (PF) 0.75 % IJ SOLN
INTRAMUSCULAR | Status: DC | PRN
  Administered 2019-07-30: 1.6 mL via INTRATHECAL

## 2019-07-30 MED ORDER — PROPOFOL 500 MG/50ML IV EMUL
INTRAVENOUS | Status: DC | PRN
  Administered 2019-07-30: 25 ug/kg/min via INTRAVENOUS

## 2019-07-30 MED ORDER — CEFAZOLIN SODIUM-DEXTROSE 2-4 GM/100ML-% IV SOLN
2.0000 g | INTRAVENOUS | Status: AC
  Administered 2019-07-30: 2 g via INTRAVENOUS
  Filled 2019-07-30: qty 100

## 2019-07-30 MED ORDER — FENTANYL CITRATE (PF) 100 MCG/2ML IJ SOLN: 25.0000 ug | INTRAMUSCULAR | Status: DC | PRN

## 2019-07-30 MED ORDER — METFORMIN HCL ER 500 MG PO TB24
500.0000 mg | ORAL_TABLET | Freq: Two times a day (BID) | ORAL | Status: DC
  Administered 2019-07-31 – 2019-08-01 (×3): 500 mg via ORAL
  Filled 2019-07-30 (×3): qty 1

## 2019-07-30 MED ORDER — CHLORHEXIDINE GLUCONATE 4 % EX LIQD
60.0000 mL | Freq: Once | CUTANEOUS | Status: DC
  Administered 2019-07-30: 4 via TOPICAL

## 2019-07-30 MED ORDER — GLIPIZIDE ER 5 MG PO TB24
10.0000 mg | ORAL_TABLET | Freq: Every day | ORAL | Status: DC
  Administered 2019-07-31 – 2019-08-01 (×2): 10 mg via ORAL
  Filled 2019-07-30 (×2): qty 2

## 2019-07-30 MED ORDER — AMLODIPINE BESYLATE 5 MG PO TABS
5.0000 mg | ORAL_TABLET | Freq: Every day | ORAL | Status: DC
  Administered 2019-07-31 – 2019-08-01 (×2): 5 mg via ORAL
  Filled 2019-07-30 (×2): qty 1

## 2019-07-30 MED ORDER — MENTHOL 3 MG MT LOZG: 1.0000 | LOZENGE | OROMUCOSAL | Status: DC | PRN

## 2019-07-30 MED ORDER — POLYETHYLENE GLYCOL 3350 17 G PO PACK: 17.0000 g | PACK | Freq: Every day | ORAL | Status: DC | PRN

## 2019-07-30 MED ORDER — SODIUM CHLORIDE 0.9 % IR SOLN
Status: DC | PRN
  Administered 2019-07-30: 1000 mL

## 2019-07-30 MED ORDER — KETOROLAC TROMETHAMINE 15 MG/ML IJ SOLN: 15.0000 mg | Freq: Once | INTRAMUSCULAR | Status: DC | PRN

## 2019-07-30 MED ORDER — ONDANSETRON HCL 4 MG PO TABS: 4.0000 mg | ORAL_TABLET | Freq: Four times a day (QID) | ORAL | Status: DC | PRN

## 2019-07-30 MED ORDER — METHOCARBAMOL 500 MG PO TABS: 500.0000 mg | ORAL_TABLET | Freq: Four times a day (QID) | ORAL | Status: DC | PRN

## 2019-07-30 MED ORDER — ASPIRIN 81 MG PO CHEW
81.0000 mg | CHEWABLE_TABLET | Freq: Two times a day (BID) | ORAL | Status: DC
  Administered 2019-07-31 – 2019-08-01 (×4): 81 mg via ORAL
  Filled 2019-07-30 (×4): qty 1

## 2019-07-30 MED ORDER — METOCLOPRAMIDE HCL 5 MG PO TABS
5.0000 mg | ORAL_TABLET | Freq: Three times a day (TID) | ORAL | Status: DC | PRN
  Administered 2019-07-30: 10 mg via ORAL

## 2019-07-30 MED ORDER — PRAVASTATIN SODIUM 20 MG PO TABS
40.0000 mg | ORAL_TABLET | Freq: Every day | ORAL | Status: DC
  Administered 2019-07-31: 40 mg via ORAL
  Filled 2019-07-30: qty 2

## 2019-07-30 MED ORDER — HYDROCODONE-ACETAMINOPHEN 5-325 MG PO TABS
1.0000 | ORAL_TABLET | ORAL | Status: DC | PRN
  Administered 2019-07-30: 1 via ORAL
  Filled 2019-07-30: qty 2

## 2019-07-30 MED ORDER — ONDANSETRON HCL 4 MG/2ML IJ SOLN
4.0000 mg | Freq: Four times a day (QID) | INTRAMUSCULAR | Status: DC | PRN
  Administered 2019-07-30: 4 mg via INTRAVENOUS
  Filled 2019-07-30: qty 2

## 2019-07-30 MED ORDER — PROMETHAZINE HCL 25 MG/ML IJ SOLN: 6.2500 mg | INTRAMUSCULAR | Status: DC | PRN

## 2019-07-30 MED ORDER — PHENOL 1.4 % MT LIQD: 1.0000 | OROMUCOSAL | Status: DC | PRN

## 2019-07-30 MED ORDER — MORPHINE SULFATE (PF) 2 MG/ML IV SOLN: 0.5000 mg | INTRAVENOUS | Status: DC | PRN

## 2019-07-30 MED ORDER — LIDOCAINE HCL (CARDIAC) PF 100 MG/5ML IV SOSY
PREFILLED_SYRINGE | INTRAVENOUS | Status: DC | PRN
  Administered 2019-07-30: 40 mg via INTRATRACHEAL

## 2019-07-30 MED ORDER — METOCLOPRAMIDE HCL 5 MG/ML IJ SOLN
5.0000 mg | Freq: Three times a day (TID) | INTRAMUSCULAR | Status: DC | PRN
  Filled 2019-07-30: qty 2

## 2019-07-30 MED ORDER — METHOCARBAMOL 500 MG IVPB - SIMPLE MED
500.0000 mg | Freq: Four times a day (QID) | INTRAVENOUS | Status: DC | PRN
  Filled 2019-07-30: qty 50

## 2019-07-30 MED ORDER — POVIDONE-IODINE 10 % EX SWAB
2.0000 "application " | Freq: Once | CUTANEOUS | Status: AC
  Administered 2019-07-30: 2 via TOPICAL

## 2019-07-30 MED ORDER — ACETAMINOPHEN 500 MG PO TABS
1000.0000 mg | ORAL_TABLET | Freq: Once | ORAL | Status: AC
  Administered 2019-07-30: 1000 mg via ORAL
  Filled 2019-07-30: qty 2

## 2019-07-30 MED ORDER — 0.9 % SODIUM CHLORIDE (POUR BTL) OPTIME
TOPICAL | Status: DC | PRN
  Administered 2019-07-30: 1000 mL

## 2019-07-30 MED ORDER — TRANEXAMIC ACID-NACL 1000-0.7 MG/100ML-% IV SOLN
1000.0000 mg | INTRAVENOUS | Status: AC
  Administered 2019-07-30: 11:00:00 1000 mg via INTRAVENOUS
  Filled 2019-07-30: qty 100

## 2019-07-30 MED ORDER — PHENYLEPHRINE 40 MCG/ML (10ML) SYRINGE FOR IV PUSH (FOR BLOOD PRESSURE SUPPORT)
PREFILLED_SYRINGE | INTRAVENOUS | Status: AC
  Filled 2019-07-30: qty 20

## 2019-07-30 MED ORDER — PROPOFOL 10 MG/ML IV BOLUS
INTRAVENOUS | Status: AC
  Filled 2019-07-30: qty 20

## 2019-07-30 MED ORDER — PHENYLEPHRINE HCL (PRESSORS) 10 MG/ML IV SOLN
INTRAVENOUS | Status: DC | PRN
  Administered 2019-07-30: 80 ug via INTRAVENOUS

## 2019-07-30 MED ORDER — DIPHENHYDRAMINE HCL 12.5 MG/5ML PO ELIX: 12.5000 mg | ORAL_SOLUTION | ORAL | Status: DC | PRN

## 2019-07-30 MED ORDER — CEFAZOLIN SODIUM-DEXTROSE 1-4 GM/50ML-% IV SOLN
1.0000 g | Freq: Four times a day (QID) | INTRAVENOUS | Status: AC
  Administered 2019-07-30 – 2019-07-31 (×2): 1 g via INTRAVENOUS
  Filled 2019-07-30 (×2): qty 50

## 2019-07-30 MED ORDER — ONDANSETRON HCL 4 MG/2ML IJ SOLN
INTRAMUSCULAR | Status: DC | PRN
  Administered 2019-07-30: 4 mg via INTRAVENOUS

## 2019-07-30 MED ORDER — LACTATED RINGERS IV SOLN
INTRAVENOUS | Status: DC
  Administered 2019-07-30 (×2): via INTRAVENOUS

## 2019-07-30 MED ORDER — ALUM & MAG HYDROXIDE-SIMETH 200-200-20 MG/5ML PO SUSP: 30.0000 mL | ORAL | Status: DC | PRN

## 2019-07-30 MED ORDER — STERILE WATER FOR IRRIGATION IR SOLN
Status: DC | PRN
  Administered 2019-07-30: 2000 mL

## 2019-07-30 MED ORDER — FINASTERIDE 5 MG PO TABS
5.0000 mg | ORAL_TABLET | Freq: Every day | ORAL | Status: DC
  Administered 2019-07-31 – 2019-08-01 (×2): 5 mg via ORAL
  Filled 2019-07-30 (×2): qty 1

## 2019-07-30 SURGICAL SUPPLY — 44 items
BAG ZIPLOCK 12X15 (MISCELLANEOUS) IMPLANT
BALL HIP ARTICU EZE 36 8.5 (Hips) IMPLANT
BENZOIN TINCTURE PRP APPL 2/3 (GAUZE/BANDAGES/DRESSINGS) IMPLANT
BLADE SAW SGTL 18X1.27X75 (BLADE) ×2 IMPLANT
BLADE SAW SGTL 18X1.27X75MM (BLADE) ×1
CLOSURE WOUND 1/2 X4 (GAUZE/BANDAGES/DRESSINGS)
COVER PERINEAL POST (MISCELLANEOUS) ×3 IMPLANT
COVER SURGICAL LIGHT HANDLE (MISCELLANEOUS) ×3 IMPLANT
COVER WAND RF STERILE (DRAPES) ×3 IMPLANT
CUP ACET PNNCL SECTR W/GRIP 56 (Hips) IMPLANT
DRAPE STERI IOBAN 125X83 (DRAPES) ×3 IMPLANT
DRAPE U-SHAPE 47X51 STRL (DRAPES) ×6 IMPLANT
DRESSING AQUACEL AG SP 3.5X10 (GAUZE/BANDAGES/DRESSINGS) IMPLANT
DRSG AQUACEL AG ADV 3.5X10 (GAUZE/BANDAGES/DRESSINGS) ×3 IMPLANT
DRSG AQUACEL AG SP 3.5X10 (GAUZE/BANDAGES/DRESSINGS) ×3
DURAPREP 26ML APPLICATOR (WOUND CARE) ×3 IMPLANT
ELECT REM PT RETURN 15FT ADLT (MISCELLANEOUS) ×3 IMPLANT
GAUZE XEROFORM 1X8 LF (GAUZE/BANDAGES/DRESSINGS) ×3 IMPLANT
GLOVE BIO SURGEON STRL SZ7.5 (GLOVE) ×3 IMPLANT
GLOVE BIOGEL PI IND STRL 8 (GLOVE) ×2 IMPLANT
GLOVE BIOGEL PI INDICATOR 8 (GLOVE) ×4
GLOVE ECLIPSE 8.0 STRL XLNG CF (GLOVE) ×3 IMPLANT
GOWN STRL REUS W/TWL XL LVL3 (GOWN DISPOSABLE) ×6 IMPLANT
HANDPIECE INTERPULSE COAX TIP (DISPOSABLE) ×2
HIP BALL ARTICU EZE 36 8.5 (Hips) ×3 IMPLANT
HOLDER FOLEY CATH W/STRAP (MISCELLANEOUS) ×3 IMPLANT
KIT TURNOVER KIT A (KITS) IMPLANT
LINER NEUTRAL 52MMX36MMX56N (Liner) ×2 IMPLANT
PACK ANTERIOR HIP CUSTOM (KITS) ×3 IMPLANT
PENCIL SMOKE EVACUATOR (MISCELLANEOUS) ×2 IMPLANT
PINN SECTOR W/GRIP ACE CUP 56 (Hips) ×3 IMPLANT
SET HNDPC FAN SPRY TIP SCT (DISPOSABLE) ×1 IMPLANT
STAPLER VISISTAT 35W (STAPLE) ×2 IMPLANT
STEM CORAIL KA14 (Stem) ×2 IMPLANT
STRIP CLOSURE SKIN 1/2X4 (GAUZE/BANDAGES/DRESSINGS) IMPLANT
SUT ETHIBOND NAB CT1 #1 30IN (SUTURE) ×3 IMPLANT
SUT ETHILON 2 0 PS N (SUTURE) IMPLANT
SUT MNCRL AB 4-0 PS2 18 (SUTURE) IMPLANT
SUT VIC AB 0 CT1 36 (SUTURE) ×3 IMPLANT
SUT VIC AB 1 CT1 36 (SUTURE) ×3 IMPLANT
SUT VIC AB 2-0 CT1 27 (SUTURE) ×4
SUT VIC AB 2-0 CT1 TAPERPNT 27 (SUTURE) ×2 IMPLANT
TRAY FOLEY MTR SLVR 16FR STAT (SET/KITS/TRAYS/PACK) ×2 IMPLANT
YANKAUER SUCT BULB TIP 10FT TU (MISCELLANEOUS) ×3 IMPLANT

## 2019-07-30 NOTE — Progress Notes (Signed)
Dr. Ninfa Linden into see patient this evening due to tremors pt is experiencing mostly occurring on rt side. Dr Ninfa Linden aware of situation. Will continue to monitor pt.  Currently sitting up in chair after physical therapy session and is no longer complaining of nausea at this time. Call bell within reach. Pain is decreased from before prn medication dosage.

## 2019-07-30 NOTE — Anesthesia Procedure Notes (Signed)
Date/Time: 07/30/2019 10:52 AM Performed by: Gean Maidens, CRNA Pre-anesthesia Checklist: Patient identified, Emergency Drugs available, Suction available, Patient being monitored and Timeout performed Patient Re-evaluated:Patient Re-evaluated prior to induction Oxygen Delivery Method: Simple face mask Placement Confirmation: positive ETCO2 and breath sounds checked- equal and bilateral Dental Injury: Teeth and Oropharynx as per pre-operative assessment

## 2019-07-30 NOTE — Anesthesia Preprocedure Evaluation (Addendum)
Anesthesia Evaluation  Patient identified by MRN, date of birth, ID band Patient awake    Reviewed: Allergy & Precautions, NPO status , Patient's Chart, lab work & pertinent test results  Airway Mallampati: II  TM Distance: >3 FB Neck ROM: Full    Dental  (+) Teeth Intact, Dental Advisory Given,    Pulmonary neg pulmonary ROS, former smoker,    Pulmonary exam normal breath sounds clear to auscultation       Cardiovascular hypertension, Pt. on medications Normal cardiovascular exam Rhythm:Regular Rate:Normal     Neuro/Psych negative neurological ROS  negative psych ROS   GI/Hepatic Neg liver ROS, GERD  Medicated and Controlled,  Endo/Other  diabetes, Type 2, Oral Hypoglycemic Agents  Renal/GU negative Renal ROS  negative genitourinary   Musculoskeletal  (+) Arthritis , Osteoarthritis,    Abdominal Normal abdominal exam  (+)   Peds negative pediatric ROS (+)  Hematology negative hematology ROS (+)   Anesthesia Other Findings HLD  Reproductive/Obstetrics negative OB ROS                            Anesthesia Physical Anesthesia Plan  ASA: III  Anesthesia Plan: Spinal and MAC   Post-op Pain Management:    Induction: Intravenous  PONV Risk Score and Plan: 1 and Propofol infusion, TIVA and Treatment may vary due to age or medical condition  Airway Management Planned: Natural Airway and Simple Face Mask  Additional Equipment: None  Intra-op Plan:   Post-operative Plan:   Informed Consent: I have reviewed the patients History and Physical, chart, labs and discussed the procedure including the risks, benefits and alternatives for the proposed anesthesia with the patient or authorized representative who has indicated his/her understanding and acceptance.       Plan Discussed with: CRNA  Anesthesia Plan Comments:         Anesthesia Quick Evaluation

## 2019-07-30 NOTE — Brief Op Note (Signed)
07/30/2019  12:01 PM  PATIENT:  Alex Carlson  78 y.o. male  PRE-OPERATIVE DIAGNOSIS:  osteoarthritis left hip  POST-OPERATIVE DIAGNOSIS:  osteoarthritis left hip  PROCEDURE:  Procedure(s): LEFT TOTAL HIP ARTHROPLASTY ANTERIOR APPROACH (Left)  SURGEON:  Surgeon(s) and Role:    Mcarthur Rossetti, MD - Primary  PHYSICIAN ASSISTANT: Benita Stabile, PA-C  ANESTHESIA:   spinal  EBL:  100 mL   COUNTS:  YES  DICTATION: .Other Dictation: Dictation Number [id number]  PLAN OF CARE: Admit for overnight observation  PATIENT DISPOSITION:  PACU - hemodynamically stable.   Delay start of Pharmacological VTE agent (>24hrs) due to surgical blood loss or risk of bleeding: no

## 2019-07-30 NOTE — Op Note (Signed)
NAMEYAKOV, LAUGHTON MEDICAL RECORD [id number] ACCOUNT [id number] DATE OF BIRTH:02-25-1941 FACILITY: [facility] LOCATION: [facility] PHYSICIAN:Garo Heidelberg Kerry Fort, MD  OPERATIVE REPORT  DATE OF PROCEDURE:  07/30/2019  PREOPERATIVE DIAGNOSIS:  Primary osteoarthritis and degenerative joint disease, left hip.  POSTOPERATIVE DIAGNOSIS:  Primary osteoarthritis and degenerative joint disease, left hip.  PROCEDURE:  Left total hip arthroplasty, direct anterior approach.  IMPLANTS:  DePuy Sector Gription acetabular component size 56, size 36+0 neutral polyethylene liner, size 14 Corail femoral component with standard offset, size 36+8.5 metal hip ball.  SURGEON:  Jonn Shingles, MD  ASSISTANT:  Erskine Emery, PA-C  ANESTHESIA:  Spinal.  ANTIBIOTICS:  Two g IV Ancef.  ESTIMATED BLOOD LOSS:  100 mL.  COMPLICATIONS:  None.  INDICATIONS:  The patient is a very pleasant 78 year old gentleman, well known to me.  He has debilitating arthritis involving both his hips and in 2018 we performed a right anterior total hip arthroplasty.  He has done well with that hip.  His left hip  has become very painful and is detrimentally affecting his activities of daily living, his mobility and his quality of life on a daily basis.  His x-rays do show severe end-stage arthritis of that left hip.  At this point, he does wish to proceed with a  total hip arthroplasty and we have recommended this as well.  Having had this before, he is fully aware of the risk of acute blood loss anemia, nerve and vessel injury, fracture, infection, dislocation, DVT and implant failure.  He understands our goals  are to decrease pain, improve mobility and overall improve quality of life.  DESCRIPTION OF PROCEDURE:  After informed consent was obtained and appropriate left hip was marked, he was brought to the operating room, sat up on a stretcher where spinal anesthesia was then obtained.  He was then laid in a supine  position on a  stretcher.  Foley catheter was placed.  I assessed his leg lengths and found them to be significantly shorter on the left operative side compared to his right hip that has been replaced.  Traction boots were placed on both his feet and we placed him  supine on the Hana fracture table, the perineal post in place and both legs in line skeletal traction device with no traction applied.  His left operative hip was then prepped and draped with DuraPrep and sterile drapes.  A time-out was called.  He was  identified, correct patient, correct left hip.  We then made an incision just inferior and posterior to the anterior superior iliac spine and carried this obliquely down the leg.  We dissected down to tensor fascia lata muscle.  Tensor fascia was then  divided longitudinally to proceed with direct anterior approach to the hip.  We identified and cauterized circumflex vessels and identified the hip capsule, opened up the hip capsule in an L-type format, finding moderate joint effusion and significant  periarticular osteophytes around the femoral head and neck.  We placed curved retractors around the medial and lateral femoral neck and made our femoral neck cut with an oscillating saw just proximal to the lesser trochanter.  We completed this with an  osteotome.  We placed a corkscrew guide in the femoral head and removed the femoral head in its entirety and found a wide area devoid of cartilage.  We then placed a bent Hohmann over the medial acetabular rim and removed remnants of the acetabular  labrum and other debris.  We then began reaming under  direct visualization from a size 44 reamer in stepwise increments up to a size 55.  With all reamers under direct visualization, the last reamer under direct fluoroscopy so we could obtain our depth  of reaming, our inclination and anteversion, I then placed the real DePuy Sector Gription acetabular component size 56 and a 36+0 neutral polyethylene liner  for that size acetabular component.  Attention was then turned to the femur.  With the leg  externally rotated to 120 degrees, extended and adducted, we were able to place a Mueller retractor medially and Hohman retractor behind the greater trochanter, released lateral joint capsule and used a box-cutting osteotome to enter the femoral canal  and a rongeur to lateralize.  We then began broaching using the Corail broaching system from a size 8 up to a size 14.  With a size 14 in place, we trialed a standard offset femoral neck and a 36+____ hip ball, reduced this in the acetabulum.  We felt  like we needed more leg length and offset.  We dislocated the hip and removed the trial components.  I then placed the real Corail femoral component size 14 with standard offset.  We went with a 36+8.5 hip ball in order to lengthen him.  We reduced this  in the acetabulum and we felt like it was stable and his leg lengths were very close.  We assessed this radiographically and clinically.  We then irrigated the soft tissue with normal saline solution using pulsatile lavage.  We were able to close the  joint capsule with interrupted #1 Ethibond suture.  We closed the tensor fascia with interrupted #1 Vicryl, followed by 0 Vicryl to close the deep tissue, 2-0 Vicryl to close the subcutaneous tissue and interrupted staples on the skin incision.  Xeroform  and Aquacel dressing was applied.  He was taken off the Hana table and taken to the recovery room in stable condition.  All final counts were correct.  There were no complications noted.  Note Benita Stabile, PA-C, assisted for the entire case.  His  assistance was crucial for facilitating all aspects of this case.  VN/NUANCE  D:07/30/2019 T:07/30/2019 JOB:[id number]

## 2019-07-30 NOTE — Transfer of Care (Signed)
Immediate Anesthesia Transfer of Care Note  Patient: Alex Carlson  Procedure(s) Performed: LEFT TOTAL HIP ARTHROPLASTY ANTERIOR APPROACH (Left Hip)  Patient Location: PACU  Anesthesia Type:Spinal  Level of Consciousness: awake, alert  and oriented  Airway & Oxygen Therapy: Patient Spontanous Breathing and Patient connected to face mask oxygen  Post-op Assessment: Report given to RN and Post -op Vital signs reviewed and stable  Post vital signs: Reviewed and stable  Last Vitals:  Vitals Value Taken Time  BP 101/70 07/30/19 1221  Temp 36.3 C 07/30/19 1221  Pulse 71 07/30/19 1222  Resp 10 07/30/19 1222  SpO2 99 % 07/30/19 1222  Vitals shown include unvalidated device data.  Last Pain:  Vitals:   07/30/19 0854  TempSrc: Oral         Complications: No apparent anesthesia complications

## 2019-07-30 NOTE — Care Plan (Signed)
Ortho Bundle Case Management Note  Patient Details  Name: Alex Carlson MRN: [id number] Date of Birth: 11-19-1940  [facility] call to patient for pre-op call prior to his upcoming Left total hip arthroplasty with Dr. Ninfa Linden on 07/30/2019. Reviewed [facility] Ortho bundle program and provided opportunity for patient to ask questions. Patient has a spouse that will be able to assist at home after discharge. He has all DME needed for post-surgery needs. He verbalized that [facility] provided his in home therapy previously for his Right THA done in 2018. Anticipate HHPT will be needed after short hospital stay. Referral provided to [facility] liaison per patient's wishes. He reports his last A1C on 07/08/19 as 6.2 with Dr. Baldomero Lamy office. Will continue to monitor for CM needs in hospital and afterward per Ortho bundle.                       DME Arranged:  (Patient has all DME needed in his home already) DME Agency:     [place] Arranged:  PT [place]:  [facility] (now [facility])  Additional Comments: Please contact me with any questions of if this plan should need to change.  Jamse Arn, RN, BSN, [employer]  [phone] 07/30/2019, 9:17 AM

## 2019-07-30 NOTE — H&P (Signed)
The patient understands fully that he is here for a left total hip arthroplasty today.  He has had no acute changes in medical status.  See current H&P.  All question concerns have been answered and addressed.  The risk and benefits of surgery been explained in detail.  Informed consent is obtained and the left hip was marked.

## 2019-07-30 NOTE — Anesthesia Procedure Notes (Signed)
Spinal  Patient location during procedure: OR Start time: 07/30/2019 10:50 AM End time: 07/30/2019 11:05 AM Staffing Performed: anesthesiologist  Anesthesiologist: Pervis Hocking, DO Preanesthetic Checklist Completed: patient identified, IV checked, site marked, risks and benefits discussed, surgical consent, monitors and equipment checked, pre-op evaluation and timeout performed Spinal Block Patient position: sitting Prep: DuraPrep and site prepped and draped Patient monitoring: heart rate, cardiac monitor, continuous pulse ox and blood pressure Approach: midline Location: L3-4 Injection technique: single-shot Needle Needle type: Sprotte and Quincke  Needle gauge: 22 G Needle length: 9 cm Assessment Sensory level: T4 Additional Notes Functioning IV was confirmed and monitors were applied. Sterile prep and drape, including hand hygiene and sterile gloves were used. The patient was positioned and the spine was prepped. The skin was anesthetized with lidocaine.  Free flow of clear CSF was obtained prior to injecting local anesthetic into the CSF.  The spinal needle aspirated freely following injection.  The needle was carefully withdrawn.  The patient tolerated the procedure well.

## 2019-07-30 NOTE — Anesthesia Postprocedure Evaluation (Signed)
Anesthesia Post Note  Patient: Alex Carlson  Procedure(s) Performed: LEFT TOTAL HIP ARTHROPLASTY ANTERIOR APPROACH (Left Hip)     Patient location during evaluation: PACU Anesthesia Type: MAC and Spinal Level of consciousness: oriented and awake and alert Pain management: pain level controlled Vital Signs Assessment: post-procedure vital signs reviewed and stable Respiratory status: spontaneous breathing and respiratory function stable Cardiovascular status: blood pressure returned to baseline and stable Postop Assessment: no headache, no backache, no apparent nausea or vomiting and patient able to bend at knees Anesthetic complications: no    Last Vitals:  Vitals:   07/30/19 1400 07/30/19 1417  BP: 138/76 (!) 144/88  Pulse: 61 66  Resp: 14   Temp: (!) 36.3 C (!) 36.4 C  SpO2: 100% 100%    Last Pain:  Vitals:   07/30/19 1417  TempSrc: Oral  PainSc:                  Pervis Hocking

## 2019-07-30 NOTE — Evaluation (Addendum)
Physical Therapy Evaluation Patient Details Name: Alex Carlson MRN: [id number] DOB: [birth year] Today's Date: 07/30/2019   History of Present Illness  Patient is 78 y.o. male s/p Lt THA anterior approach on 07/30/19 with PMH significant for DM, HTN, GERD, OA, and Rt THA in 2018.    Clinical Impression  Alex Carlson is a 78 y.o. male POD 0 s/p Lt THA anterior approach. Patient reports independence with mobility at baseline. Patient is now limited by functional impairments (see PT problem list below) and requires min assist for transfers and gait with RW. Patient was able to ambulate short distance in room and declined to mobilize in hallway due to feeling unsteady. He then became nauseous and seated rest provided. Pt had 1 bout of emesis. Patient instructed in exercise to facilitate circulation and educated on fall risk and need for assistance to mobilize. Patient will benefit from continued skilled PT interventions to address impairments and progress towards PLOF. Acute PT will follow to progress mobility and stair training in preparation for safe discharge home.    Follow Up Recommendations Follow surgeon's recommendation for DC plan and follow-up therapies    Equipment Recommendations  None recommended by PT    Recommendations for Other Services       Precautions / Restrictions Precautions Precautions: Fall Restrictions Weight Bearing Restrictions: No      Mobility  Bed Mobility Overal bed mobility: Needs Assistance Bed Mobility: Supine to Sit     Supine to sit: HOB elevated;Min assist     General bed mobility comments: cues for use of bed rails, assist to pivot and bring LE's to EOB  Transfers Overall transfer level: Needs assistance Equipment used: Rolling walker (2 wheeled) Transfers: Sit to/from Stand Sit to Stand: Min assist         General transfer comment: cues for hand placement and technique with RW, assist initiate power up and complete/steady with  rise.  Ambulation/Gait Ambulation/Gait assistance: Min assist Gait Distance (Feet): 5 Feet Assistive device: Rolling walker (2 wheeled) Gait Pattern/deviations: Step-through pattern;Decreased stride length;Decreased step length - right;Decreased step length - left;Decreased stance time - left Gait velocity: decreased   General Gait Details: pt declined to ambulate in hallway due to feelign unsteady and nauseous. cues for step pattern at start and to perform backwards step within RW to step to recliner. assist for walker management.  Stairs            Wheelchair Mobility    Modified Rankin (Stroke Patients Only)       Balance Overall balance assessment: Needs assistance Sitting-balance support: Feet supported Sitting balance-Leahy Scale: Good     Standing balance support: During functional activity;Bilateral upper extremity supported Standing balance-Leahy Scale: Fair                Pertinent Vitals/Pain Pain Assessment: 0-10 Pain Score: 3  Pain Location: Lt hip Pain Descriptors / Indicators: Aching;Sore Pain Intervention(s): Limited activity within patient's tolerance;Monitored during session;Repositioned    Home Living Family/patient expects to be discharged to:: Private residence Living Arrangements: Spouse/significant other Available Help at Discharge: Family;Available 24 hours/day Type of Home: House Home Access: Level entry     Home Layout: One level Home Equipment: Walker - 2 wheels;Cane - single point;Grab bars - tub/shower;Grab bars - toilet;Shower seat - built in;Bedside commode Additional Comments: when asked about bathroom setup pt laughing and stating "I even have a jacuzzi tub that I ain't been in but 1 time in 16 years"    Prior Function Level  of Independence: Independent               Hand Dominance   Dominant Hand: Right    Extremity/Trunk Assessment   Upper Extremity Assessment Upper Extremity Assessment: Overall WFL for  tasks assessed    Lower Extremity Assessment Lower Extremity Assessment: Overall WFL for tasks assessed;LLE deficits/detail LLE Deficits / Details: pt with good quad activation and good hip ROM with flexion in supine. LLE Sensation: WNL LLE Coordination: WNL    Cervical / Trunk Assessment Cervical / Trunk Assessment: Normal  Communication   Communication: No difficulties  Cognition Arousal/Alertness: Awake/alert Behavior During Therapy: WFL for tasks assessed/performed Overall Cognitive Status: Within Functional Limits for tasks assessed               General Comments      Exercises Total Joint Exercises Ankle Circles/Pumps: AROM;10 reps;Seated;Both   Assessment/Plan    PT Assessment Patient needs continued PT services  PT Problem List Decreased strength;Decreased balance;Decreased mobility;Decreased range of motion;Decreased activity tolerance;Decreased knowledge of use of DME       PT Treatment Interventions DME instruction;Therapeutic activities;Gait training;Stair training;Therapeutic exercise;Functional mobility training;Balance training;Patient/family education    PT Goals (Current goals can be found in the Care Plan section)  Acute Rehab PT Goals Patient Stated Goal: to stop feeling sick and get home PT Goal Formulation: With patient Time For Goal Achievement: 08/06/19 Potential to Achieve Goals: Good    Frequency 7X/week    AM-PAC PT "6 Clicks" Mobility  Outcome Measure Help needed turning from your back to your side while in a flat bed without using bedrails?: A Little Help needed moving from lying on your back to sitting on the side of a flat bed without using bedrails?: A Little Help needed moving to and from a bed to a chair (including a wheelchair)?: A Little Help needed standing up from a chair using your arms (e.g., wheelchair or bedside chair)?: A Little Help needed to walk in hospital room?: A Little Help needed climbing 3-5 steps with a  railing? : A Little 6 Click Score: 18    End of Session Equipment Utilized During Treatment: Gait belt Activity Tolerance: Patient tolerated treatment well Patient left: with call bell/phone within reach;in chair Nurse Communication: Mobility status;Other (comment)(alerted no psey for chair alarm in room) PT Visit Diagnosis: Muscle weakness (generalized) (M62.81);Difficulty in walking, not elsewhere classified (R26.2)    Time: [id number] PT Time Calculation (min) (ACUTE ONLY): 22 min   Charges:   PT Evaluation $PT Eval Low Complexity: 1 Low          Kipp Brood, PT, DPT Physical Therapist with [facility]  07/30/2019 5:35 PM

## 2019-07-30 NOTE — Plan of Care (Signed)

## 2019-07-31 DIAGNOSIS — Z79899 Other long term (current) drug therapy: Secondary | ICD-10-CM | POA: Diagnosis not present

## 2019-07-31 DIAGNOSIS — E7800 Pure hypercholesterolemia, unspecified: Secondary | ICD-10-CM | POA: Diagnosis present

## 2019-07-31 DIAGNOSIS — Z20828 Contact with and (suspected) exposure to other viral communicable diseases: Secondary | ICD-10-CM | POA: Diagnosis present

## 2019-07-31 DIAGNOSIS — Z96641 Presence of right artificial hip joint: Secondary | ICD-10-CM | POA: Diagnosis present

## 2019-07-31 DIAGNOSIS — Z87891 Personal history of nicotine dependence: Secondary | ICD-10-CM | POA: Diagnosis not present

## 2019-07-31 DIAGNOSIS — Z7984 Long term (current) use of oral hypoglycemic drugs: Secondary | ICD-10-CM | POA: Diagnosis not present

## 2019-07-31 DIAGNOSIS — I10 Essential (primary) hypertension: Secondary | ICD-10-CM | POA: Diagnosis present

## 2019-07-31 DIAGNOSIS — Z96642 Presence of left artificial hip joint: Secondary | ICD-10-CM

## 2019-07-31 DIAGNOSIS — Z888 Allergy status to other drugs, medicaments and biological substances status: Secondary | ICD-10-CM | POA: Diagnosis not present

## 2019-07-31 DIAGNOSIS — K219 Gastro-esophageal reflux disease without esophagitis: Secondary | ICD-10-CM | POA: Diagnosis present

## 2019-07-31 DIAGNOSIS — M1612 Unilateral primary osteoarthritis, left hip: Secondary | ICD-10-CM | POA: Diagnosis present

## 2019-07-31 DIAGNOSIS — E119 Type 2 diabetes mellitus without complications: Secondary | ICD-10-CM | POA: Diagnosis present

## 2019-07-31 DIAGNOSIS — Z7982 Long term (current) use of aspirin: Secondary | ICD-10-CM | POA: Diagnosis not present

## 2019-07-31 LAB — BASIC METABOLIC PANEL
Anion gap: 11 (ref 5–15)
BUN: 15 mg/dL (ref 8–23)
CO2: 22 mmol/L (ref 22–32)
Calcium: 8.5 mg/dL — ABNORMAL LOW (ref 8.9–10.3)
Chloride: 99 mmol/L (ref 98–111)
Creatinine, Ser: 0.77 mg/dL (ref 0.61–1.24)
GFR calc Af Amer: >60 mL/min (ref >60)
GFR calc non Af Amer: >60 mL/min (ref >60)
Glucose, Bld: 184 mg/dL — ABNORMAL HIGH (ref 70–99)
Potassium: 4.2 mmol/L (ref 3.5–5.1)
Sodium: 132 mmol/L — ABNORMAL LOW (ref 135–145)

## 2019-07-31 LAB — CBC
HCT: 37.9 % — ABNORMAL LOW (ref 39.0–52.0)
Hemoglobin: 12.2 g/dL — ABNORMAL LOW (ref 13.0–17.0)
MCH: 30.2 pg (ref 26.0–34.0)
MCHC: 32.2 g/dL (ref 30.0–36.0)
MCV: 93.8 fL (ref 80.0–100.0)
Platelets: 218 10*3/uL (ref 150–400)
RBC: 4.04 MIL/uL — ABNORMAL LOW (ref 4.22–5.81)
RDW: 13.2 % (ref 11.5–15.5)
WBC: 8.3 10*3/uL (ref 4.0–10.5)
nRBC: 0 % (ref 0.0–0.2)

## 2019-07-31 LAB — GLUCOSE, CAPILLARY
Glucose-Capillary: 151 mg/dL — ABNORMAL HIGH (ref 70–99)
Glucose-Capillary: 204 mg/dL — ABNORMAL HIGH (ref 70–99)
Glucose-Capillary: 163 mg/dL — ABNORMAL HIGH (ref 70–99)

## 2019-07-31 MED ORDER — METHOCARBAMOL 500 MG PO TABS: 500.0000 mg | ORAL_TABLET | Freq: Four times a day (QID) | ORAL | 1 refills | Status: DC | PRN

## 2019-07-31 MED ORDER — ASPIRIN 81 MG PO CHEW: 81.0000 mg | CHEWABLE_TABLET | Freq: Two times a day (BID) | ORAL | 0 refills | Status: AC

## 2019-07-31 MED ORDER — HYDROCODONE-ACETAMINOPHEN 5-325 MG PO TABS: 1.0000 | ORAL_TABLET | Freq: Four times a day (QID) | ORAL | 0 refills | Status: DC | PRN

## 2019-07-31 NOTE — Progress Notes (Signed)
Physical Therapy Treatment Patient Details Name: Alex Carlson MRN: [id number] DOB: [birth year] Today's Date: 07/31/2019    History of Present Illness Patient is 78 y.o. male s/p Lt THA anterior approach on 07/30/19 with PMH significant for DM, HTN, GERD, OA, and Rt THA in 2018.    PT Comments    Pt progressing well. Still needing assist with bed mobility and transfers, will benefit from continued PT  in acute setting.   Follow Up Recommendations  Follow surgeon's recommendation for DC plan and follow-up therapies     Equipment Recommendations  None recommended by PT    Recommendations for Other Services       Precautions / Restrictions Precautions Precautions: Fall Restrictions Weight Bearing Restrictions: No    Mobility  Bed Mobility   Bed Mobility: Supine to Sit     Supine to sit: Min assist     General bed mobility comments: assist with LLE, incr time and effort  Transfers Overall transfer level: Needs assistance Equipment used: Rolling walker (2 wheeled) Transfers: Sit to/from Stand Sit to Stand: Min guard         General transfer comment: cues for hand placement   Ambulation/Gait Ambulation/Gait assistance: Min guard;Min assist Gait Distance (Feet): 120 Feet Assistive device: Rolling walker (2 wheeled) Gait Pattern/deviations: Step-to pattern;Step-through pattern;Decreased stance time - left Gait velocity: decreased   General Gait Details: cues for sequence and RW safety   Stairs             Wheelchair Mobility    Modified Rankin (Stroke Patients Only)       Balance     Sitting balance-Leahy Scale: Good                                      Cognition Arousal/Alertness: Awake/alert Behavior During Therapy: WFL for tasks assessed/performed Overall Cognitive Status: Within Functional Limits for tasks assessed                                        Exercises Total Joint Exercises Ankle  Circles/Pumps: AROM;Both;10 reps Quad Sets: AROM;Both;10 reps    General Comments        Pertinent Vitals/Pain Pain Assessment: 0-10 Pain Score: 2  Pain Location: Lt hip Pain Descriptors / Indicators: Aching;Sore Pain Intervention(s): Limited activity within patient's tolerance;Monitored during session;Repositioned    Home Living                      Prior Function            PT Goals (current goals can now be found in the care plan section) Acute Rehab PT Goals Patient Stated Goal: to stop feeling sick and get home PT Goal Formulation: With patient Time For Goal Achievement: 08/06/19 Potential to Achieve Goals: Good Progress towards PT goals: Progressing toward goals    Frequency    7X/week      PT Plan Current plan remains appropriate    Co-evaluation              AM-PAC PT "6 Clicks" Mobility   Outcome Measure  Help needed turning from your back to your side while in a flat bed without using bedrails?: A Little Help needed moving from lying on your back to sitting on the side of a flat bed without  using bedrails?: A Little Help needed moving to and from a bed to a chair (including a wheelchair)?: A Little Help needed standing up from a chair using your arms (e.g., wheelchair or bedside chair)?: A Little Help needed to walk in hospital room?: A Little Help needed climbing 3-5 steps with a railing? : A Little 6 Click Score: 18    End of Session Equipment Utilized During Treatment: Gait belt Activity Tolerance: Patient tolerated treatment well Patient left: in chair;with call bell/phone within reach(no alarm box)   PT Visit Diagnosis: Muscle weakness (generalized) (M62.81);Difficulty in walking, not elsewhere classified (R26.2)     Time: [id number] PT Time Calculation (min) (ACUTE ONLY): 26 min  Charges:  $Gait Training: 23-37 mins                     Kingsley Farace, PT   Acute Rehab Dept [facility]):  [id number]   07/31/2019    [facility] 07/31/2019, 11:47 AM

## 2019-07-31 NOTE — Discharge Instructions (Signed)

## 2019-07-31 NOTE — Progress Notes (Signed)
Physical Therapy Treatment Patient Details Name: Alex Carlson MRN: [id number] DOB: 1940/11/17 Today's Date: 07/31/2019    History of Present Illness Patient is 78 y.o. male s/p Lt THA anterior approach on 07/30/19 with PMH significant for DM, HTN, GERD, OA, and Rt THA in 2018.    PT Comments    Pt is progressing toward PT goals. Incr amb distance/tolerance this pm.  Reviewed THA HEP and pt tolerated well.  Continue PT POC  Follow Up Recommendations  Follow surgeon's recommendation for DC plan and follow-up therapies     Equipment Recommendations  None recommended by PT    Recommendations for Other Services       Precautions / Restrictions Precautions Precautions: Fall Restrictions Weight Bearing Restrictions: No    Mobility  Bed Mobility                  Transfers Overall transfer level: Needs assistance Equipment used: Rolling walker (2 wheeled) Transfers: Sit to/from Stand Sit to Stand: Min guard         General transfer comment: cues for hand placement   Ambulation/Gait Ambulation/Gait assistance: Min guard;Min assist Gait Distance (Feet): 150 Feet Assistive device: Rolling walker (2 wheeled) Gait Pattern/deviations: Step-to pattern;Step-through pattern;Decreased stance time - left;Wide base of support Gait velocity: decreased   General Gait Details: cues for sequence and RW safety   Stairs             Wheelchair Mobility    Modified Rankin (Stroke Patients Only)       Balance     Sitting balance-Leahy Scale: Good       Standing balance-Leahy Scale: Fair                              Cognition Arousal/Alertness: Awake/alert Behavior During Therapy: WFL for tasks assessed/performed Overall Cognitive Status: Within Functional Limits for tasks assessed                                        Exercises Total Joint Exercises Ankle Circles/Pumps: AROM;Both;10 reps Quad Sets: AROM;Both;10  reps Short Arc Quad: AROM;Left;10 reps Heel Slides: AROM;Left;AAROM;10 reps Hip ABduction/ADduction: AAROM;Left;10 reps    General Comments        Pertinent Vitals/Pain Pain Assessment: 0-10 Pain Score: 2  Pain Location: Lt hip Pain Descriptors / Indicators: Aching;Sore Pain Intervention(s): Monitored during session;Limited activity within patient's tolerance;Ice applied    Home Living                      Prior Function            PT Goals (current goals can now be found in the care plan section) Acute Rehab PT Goals Patient Stated Goal: to stop feeling sick and get home PT Goal Formulation: With patient Time For Goal Achievement: 08/06/19 Potential to Achieve Goals: Good Progress towards PT goals: Progressing toward goals    Frequency    7X/week      PT Plan Current plan remains appropriate    Co-evaluation              AM-PAC PT "6 Clicks" Mobility   Outcome Measure  Help needed turning from your back to your side while in a flat bed without using bedrails?: A Little Help needed moving from lying on your back to sitting on the  side of a flat bed without using bedrails?: A Little Help needed moving to and from a bed to a chair (including a wheelchair)?: A Little Help needed standing up from a chair using your arms (e.g., wheelchair or bedside chair)?: A Little Help needed to walk in hospital room?: A Little Help needed climbing 3-5 steps with a railing? : A Little 6 Click Score: 18    End of Session Equipment Utilized During Treatment: Gait belt Activity Tolerance: Patient tolerated treatment well Patient left: in chair;with call bell/phone within reach;with family/visitor present(no alarm box)   PT Visit Diagnosis: Muscle weakness (generalized) (M62.81);Difficulty in walking, not elsewhere classified (R26.2)     Time: [id number] PT Time Calculation (min) (ACUTE ONLY): 18 min  Charges:  $Gait Training: 8-22 mins                      Baxter Flattery, PT   Acute Rehab Dept [facility]): [id number]   07/31/2019    [facility] 07/31/2019, 4:31 PM

## 2019-07-31 NOTE — TOC Progression Note (Signed)
Transition of Care [facility]) - Progression Note    Patient Details  Name: Arnez Kosterman MRN: [id number] Date of Birth: [birth year]  Transition of Care [facility] CM/SW Contact  Joaquin Courts, RN Phone Number: 07/31/2019, 11:12 AM  Clinical Narrative:    CM spoke with patient at bedside. [facility] with [facility] for [place]. Reports has rolling walker and 3-in-1 at home.    Expected Discharge Plan: [place] Barriers to Discharge: Continued Medical Work up  Expected Discharge Plan and Services Expected Discharge Plan: [place]   Discharge Planning Services: CM Consult Post Acute Care Choice: [place] arrangements for the past 2 months: Single Family Home                 DME Arranged: N/A DME Agency: NA       HH Arranged: PT [place]: [place] (now [facility])         Social Determinants of Health (SDOH) Interventions    Readmission Risk Interventions No flowsheet data found.

## 2019-07-31 NOTE — Progress Notes (Signed)
Subjective: 1 Day Post-Op Procedure(s) (LRB): LEFT TOTAL HIP ARTHROPLASTY ANTERIOR APPROACH (Left) Patient reports pain as moderate.    Objective: Vital signs in last 24 hours: Temp:  [96.2 F (35.7 C)-99.8 F (37.7 C)] 99.8 F (37.7 C) (12/12 0909) Pulse Rate:  [56-114] 114 (12/12 0909) Resp:  [10-20] 16 (12/12 0909) BP: (101-146)/(63-88) 146/81 (12/12 0909) SpO2:  [94 %-100 %] 94 % (12/12 0909) Weight:  [79.8 kg] 79.8 kg (12/11 1417)  Intake/Output from previous day: 12/11 0701 - 12/12 0700 In: 3318.6 [P.O.:180; I.V.:2988.6; IV Piggyback:150] Out: 1350 [Urine:1250; Blood:100] Intake/Output this shift: Total I/O In: 240 [P.O.:240] Out: 200 [Urine:200]  Recent Labs    07/31/19 0454  HGB 12.2*   Recent Labs    07/31/19 0454  WBC 8.3  RBC 4.04*  HCT 37.9*  PLT 218   Recent Labs    07/31/19 0454  NA 132*  K 4.2  CL 99  CO2 22  BUN 15  CREATININE 0.77  GLUCOSE 184*  CALCIUM 8.5*   No results for input(s): LABPT, INR in the last 72 hours.  Sensation intact distally Intact pulses distally Dorsiflexion/Plantar flexion intact Incision: dressing C/D/I   Assessment/Plan: 1 Day Post-Op Procedure(s) (LRB): LEFT TOTAL HIP ARTHROPLASTY ANTERIOR APPROACH (Left) Up with therapy Plan for discharge tomorrow Discharge home with home health      Mcarthur Rossetti 07/31/2019, 10:28 AM

## 2019-08-01 NOTE — Discharge Summary (Signed)
Patient ID: Alex Carlson MRN: [id number] DOB/AGE: 80-Mar-1942 80 y.o.  Admit date: 07/30/2019 Discharge date: 08/01/2019  Admission Diagnoses:  Principal Problem:   Unilateral primary osteoarthritis, left hip Active Problems:   Status post total replacement of left hip   Status post total hip replacement, left   Discharge Diagnoses:  Same  Past Medical History:  Diagnosis Date  . Arthritis    "hips" (10/09/2016)  . GERD (gastroesophageal reflux disease)   . High cholesterol   . Hypertension   . Type II diabetes mellitus ([place])     Surgeries: Procedure(s): LEFT TOTAL HIP ARTHROPLASTY ANTERIOR APPROACH on 07/30/2019   Consultants:   Discharged Condition: Improved  Hospital Course: Alex Carlson is an 78 y.o. male who was admitted 07/30/2019 for operative treatment ofUnilateral primary osteoarthritis, left hip. Patient has severe unremitting pain that affects sleep, daily activities, and work/hobbies. After pre-op clearance the patient was taken to the operating room on 07/30/2019 and underwent  Procedure(s): LEFT TOTAL HIP ARTHROPLASTY ANTERIOR APPROACH.    Patient was given perioperative antibiotics:  Anti-infectives (From admission, onward)   Start     Dose/Rate Route Frequency Ordered Stop   07/30/19 1700  ceFAZolin (ANCEF) IVPB 1 g/50 mL premix     1 g 100 mL/hr over 30 Minutes Intravenous Every 6 hours 07/30/19 1451 07/31/19 0045   07/30/19 0845  ceFAZolin (ANCEF) IVPB 2g/100 mL premix     2 g 200 mL/hr over 30 Minutes Intravenous On call to O.R. 07/30/19 [id number] 07/30/19 1132       Patient was given sequential compression devices, early ambulation, and chemoprophylaxis to prevent DVT.  Patient benefited maximally from hospital stay and there were no complications.    Recent vital signs:  Patient Vitals for the past 24 hrs:  BP Temp Temp src Pulse Resp SpO2  08/01/19 0603 135/83 98.7 F (37.1 C) Oral (!) 109 20 100 %  08/01/19 0256 (!) 155/90 (!) 100.4 F  (38 C) Oral (!) 118 20 100 %  07/31/19 2100 (!) 159/83 (!) 100.8 F (38.2 C) Oral (!) 114 20 96 %  07/31/19 1751 (!) 151/78 99.4 F (37.4 C) Oral (!) 118 16 97 %  07/31/19 1352 (!) 148/84 (!) 100.5 F (38.1 C) Oral (!) 120 16 98 %  07/31/19 1034 (!) 142/84 99 F (37.2 C) Oral (!) 118 16 99 %     Recent laboratory studies:  Recent Labs    07/31/19 0454  WBC 8.3  HGB 12.2*  HCT 37.9*  PLT 218  NA 132*  K 4.2  CL 99  CO2 22  BUN 15  CREATININE 0.77  GLUCOSE 184*  CALCIUM 8.5*     Discharge Medications:   Allergies as of 08/01/2019      Reactions   Lisinopril Swelling   SWELLING OF LIPS      Medication List    TAKE these medications   amLODipine 5 MG tablet Commonly known as: NORVASC Take 5 mg by mouth daily.   aspirin 81 MG chewable tablet Chew 1 tablet (81 mg total) by mouth 2 (two) times daily. What changed: when to take this   CALCIUM 1200 PO Take 1,200 mg by mouth 2 (two) times daily.   [employer] Fish Oil 1200 MG Caps Take 1,200 mg by mouth 2 (two) times daily.   finasteride 5 MG tablet Commonly known as: PROSCAR Take 5 mg by mouth daily.   glipiZIDE XL 10 MG 24 hr tablet Generic drug: glipiZIDE Take 10 mg by  mouth daily with breakfast.   HYDROcodone-acetaminophen 5-325 MG tablet Commonly known as: NORCO/VICODIN Take 1-2 tablets by mouth every 6 (six) hours as needed for moderate pain (pain score 4-6).   lovastatin 40 MG tablet Commonly known as: MEVACOR Take 40 mg by mouth daily.   metFORMIN 500 MG 24 hr tablet Commonly known as: GLUCOPHAGE-XR Take 500 mg by mouth 2 (two) times daily.   methocarbamol 500 MG tablet Commonly known as: ROBAXIN Take 1 tablet (500 mg total) by mouth every 6 (six) hours as needed for muscle spasms.   pantoprazole 40 MG tablet Commonly known as: PROTONIX Take 40 mg by mouth every evening.   Tylenol Arthritis Pain 650 MG CR tablet Generic drug: acetaminophen Take 1,300 mg by mouth every 8 (eight) hours as  needed for pain.   Vitamin D3 25 MCG (1000 UT) Caps Take 1,000 Units by mouth every evening.            Durable Medical Equipment  (From admission, onward)         Start     Ordered   07/30/19 1452  DME Walker rolling  Once    Question:  Patient needs a walker to treat with the following condition  Answer:  Status post total replacement of left hip   07/30/19 1451   07/30/19 1452  DME 3 n 1  Once     07/30/19 1451          Diagnostic Studies: DG Pelvis Portable  Result Date: 07/30/2019 CLINICAL DATA:  Total left hip arthroplasty. EXAM: PORTABLE PELVIS 1-2 VIEWS COMPARISON:  06/30/2019 FINDINGS: Well seated components of a total left hip arthroplasty are noted. No complicating features. A remote right total hip arthroplasty is also noted. The bony pelvis is intact. IMPRESSION: Well seated components of a total left hip arthroplasty without complicating features. Electronically Signed   By: Marijo Sanes M.D.   On: 07/30/2019 12:37   DG C-Arm 1-60 Min-No Report  Result Date: 07/30/2019 Fluoroscopy was utilized by the requesting physician.  No radiographic interpretation.   DG HIP OPERATIVE UNILAT W OR W/O PELVIS LEFT  Result Date: 07/30/2019 CLINICAL DATA:  Order for left total hip replacement Total fluoro time 12 secs EXAM: OPERATIVE LEFT HIP (WITH PELVIS IF PERFORMED) to VIEWS TECHNIQUE: Fluoroscopic spot image(s) were submitted for interpretation post-operatively. COMPARISON:  06/30/2019 FINDINGS: Interval a total LEFT hip arthroplasty. No complicating features. RIGHT arthroplasty noted IMPRESSION: Post LEFT hip arthroplasty without complication. Electronically Signed   By: Suzy Bouchard M.D.   On: 07/30/2019 12:29    Disposition: Discharge disposition: 01-Home or Self Care         Follow-up Information    Mcarthur Rossetti, MD. Go on 08/11/2019.   Specialty: Orthopedic Surgery Why: at 9:15 am for your 2 week post-op appointment with Dr.  Ninfa Linden. Contact information: [place] [address] [phone]        [facility] up.   Specialty: Home Health Services Why: You have been authorized for 5 in home physical therapy visits after discharge from the hospital. Someone from the agency will be in contact with you after discharge to arrange your first in home visit.  Contact information: [address] [place] [address] [phone]            Signed: Mcarthur Rossetti 08/01/2019, 10:19 AM

## 2019-08-01 NOTE — Plan of Care (Signed)
  Problem: Education: Goal: Knowledge of General Education information will improve Description: Including pain rating scale, medication(s)/side effects and non-pharmacologic comfort measures Outcome: Progressing   Problem: Clinical Measurements: Goal: Will remain free from infection Outcome: Progressing   Problem: Nutrition: Goal: Adequate nutrition will be maintained Outcome: Progressing   Problem: Elimination: Goal: Will not experience complications related to bowel motility Outcome: Progressing   Problem: Pain Managment: Goal: General experience of comfort will improve Outcome: Progressing   Problem: Safety: Goal: Ability to remain free from injury will improve Outcome: Progressing

## 2019-08-01 NOTE — Progress Notes (Signed)
Physical Therapy Treatment Patient Details Name: Alex Carlson MRN: [id number] DOB: 27-Jun-1941 Today's Date: 08/01/2019    History of Present Illness Patient is 78 y.o. male s/p Lt THA anterior approach on 07/30/19 with PMH significant for DM, HTN, GERD, OA, and Rt THA in 2018.    PT Comments    Pt progressing toward goals. Had a temp last night, had an episode of posterior LOB this am on initial standing. Will see again later today and pt should be ready to d/c at that time.   Follow Up Recommendations  Follow surgeon's recommendation for DC plan and follow-up therapies     Equipment Recommendations  None recommended by PT    Recommendations for Other Services       Precautions / Restrictions Precautions Precautions: Fall Restrictions Weight Bearing Restrictions: No    Mobility  Bed Mobility Overal bed mobility: Needs Assistance Bed Mobility: Supine to Sit     Supine to sit: Min guard     General bed mobility comments: gait belt used as leg lifter for pt to self assist LLE, incr time and effort  Transfers Overall transfer level: Needs assistance Equipment used: Rolling walker (2 wheeled) Transfers: Sit to/from Stand Sit to Stand: Min guard;Min assist         General transfer comment: cues for hand placement, pt with posterior LOB on initial standing, lowered to bed, no LOB on 2nd attempt to stand  Ambulation/Gait Ambulation/Gait assistance: Min guard;Supervision Gait Distance (Feet): 120 Feet Assistive device: Rolling walker (2 wheeled) Gait Pattern/deviations: Step-to pattern;Step-through pattern;Decreased stance time - left;Wide base of support     General Gait Details: cues for sequence and RW safety   Stairs             Wheelchair Mobility    Modified Rankin (Stroke Patients Only)       Balance             Standing balance-Leahy Scale: Poor(reliant on UEs, posterior LOB x1 today)                               Cognition Arousal/Alertness: Awake/alert Behavior During Therapy: WFL for tasks assessed/performed Overall Cognitive Status: Within Functional Limits for tasks assessed                                        Exercises      General Comments        Pertinent Vitals/Pain Pain Assessment: 0-10 Pain Score: 0-No pain    Home Living                      Prior Function            PT Goals (current goals can now be found in the care plan section) Acute Rehab PT Goals Patient Stated Goal: to stop feeling sick and get home PT Goal Formulation: With patient Time For Goal Achievement: 08/06/19 Potential to Achieve Goals: Good Progress towards PT goals: Progressing toward goals    Frequency    7X/week      PT Plan Current plan remains appropriate    Co-evaluation              AM-PAC PT "6 Clicks" Mobility   Outcome Measure  Help needed turning from your back to your side while in a flat  bed without using bedrails?: A Little Help needed moving from lying on your back to sitting on the side of a flat bed without using bedrails?: A Little Help needed moving to and from a bed to a chair (including a wheelchair)?: A Little Help needed standing up from a chair using your arms (e.g., wheelchair or bedside chair)?: A Little Help needed to walk in hospital room?: A Little Help needed climbing 3-5 steps with a railing? : A Little 6 Click Score: 18    End of Session Equipment Utilized During Treatment: Gait belt Activity Tolerance: Patient tolerated treatment well Patient left: Other (comment)(bathroom--NT made aware)   PT Visit Diagnosis: Muscle weakness (generalized) (M62.81);Difficulty in walking, not elsewhere classified (R26.2)     Time: [id number] PT Time Calculation (min) (ACUTE ONLY): 16 min  Charges:  $Gait Training: 8-22 mins                     Lexie Morini, PT   Acute Rehab Dept [facility]):  [id number]   08/01/2019    [facility] 08/01/2019, 11:00 AM

## 2019-08-01 NOTE — Progress Notes (Signed)
Patient running temp of 100.4 and 100.8, Tylenol 650mg  Po administered.

## 2019-08-01 NOTE — Progress Notes (Signed)
Assessment unchanged. Pt verbalized understanding of dc instructions through teach back including follow up care and when to call to the doctor. Discharged via wc to front entrance accompanied by NT and wife.

## 2019-08-01 NOTE — Progress Notes (Signed)
Physical Therapy Treatment Patient Details Name: Alex Carlson MRN: [id number] DOB: 1940/12/18 Today's Date: 08/01/2019    History of Present Illness Patient is 78 y.o. male s/p Lt THA anterior approach on 07/30/19 with PMH significant for DM, HTN, GERD, OA, and Rt THA in 2018.    PT Comments    Pt progressing well. incr gait distance, no LOB this pm. Reviewed THA HEP. Pt feels ready to d/c home    Follow Up Recommendations  Follow surgeon's recommendation for DC plan and follow-up therapies     Equipment Recommendations  None recommended by PT    Recommendations for Other Services       Precautions / Restrictions Precautions Precautions: Fall Restrictions Weight Bearing Restrictions: No    Mobility  Bed Mobility               General bed mobility comments: pt on EOB on arrival  Transfers Overall transfer level: Needs assistance Equipment used: Rolling walker (2 wheeled) Transfers: Sit to/from Stand Sit to Stand: Min guard;Supervision         General transfer comment: incr time, close supervision to min/guard for safety  Ambulation/Gait Ambulation/Gait assistance: Supervision Gait Distance (Feet): 160 Feet Assistive device: Rolling walker (2 wheeled) Gait Pattern/deviations: Step-to pattern;Step-through pattern;Decreased stance time - left;Wide base of support     General Gait Details: cues for wt shift, gait progression   Stairs             Wheelchair Mobility    Modified Rankin (Stroke Patients Only)       Balance                                            Cognition Arousal/Alertness: Awake/alert Behavior During Therapy: WFL for tasks assessed/performed Overall Cognitive Status: Within Functional Limits for tasks assessed                                        Exercises Total Joint Exercises Ankle Circles/Pumps: AROM;Both;10 reps Quad Sets: AROM;Both;10 reps Heel Slides: AROM;Left;AAROM;10  reps Hip ABduction/ADduction: AAROM;Left;10 reps Long Arc Quad: AROM;Left;15 reps;Seated    General Comments        Pertinent Vitals/Pain Pain Assessment: 0-10 Pain Score: 1  Pain Location: Lt hip Pain Descriptors / Indicators: Tightness Pain Intervention(s): Limited activity within patient's tolerance;Monitored during session;Repositioned;Ice applied    Home Living                      Prior Function            PT Goals (current goals can now be found in the care plan section) Acute Rehab PT Goals Patient Stated Goal: to stop feeling sick and get home PT Goal Formulation: With patient Time For Goal Achievement: 08/06/19 Potential to Achieve Goals: Good    Frequency    7X/week      PT Plan Current plan remains appropriate    Co-evaluation              AM-PAC PT "6 Clicks" Mobility   Outcome Measure  Help needed turning from your back to your side while in a flat bed without using bedrails?: A Little Help needed moving from lying on your back to sitting on the side of a flat bed without  using bedrails?: A Little Help needed moving to and from a bed to a chair (including a wheelchair)?: A Little Help needed standing up from a chair using your arms (e.g., wheelchair or bedside chair)?: A Little Help needed to walk in hospital room?: A Little Help needed climbing 3-5 steps with a railing? : A Little 6 Click Score: 18    End of Session Equipment Utilized During Treatment: Gait belt Activity Tolerance: Patient tolerated treatment well Patient left: in chair;with call bell/phone within reach   PT Visit Diagnosis: Muscle weakness (generalized) (M62.81);Difficulty in walking, not elsewhere classified (R26.2)     Time: [id number] PT Time Calculation (min) (ACUTE ONLY): 14 min  Charges:  $Therapeutic Exercise: 8-22 mins                     Baxter Flattery, PT   Acute Rehab Dept [facility]): [id number]   08/01/2019    [facility] 08/01/2019, 2:51  PM

## 2019-08-01 NOTE — Progress Notes (Signed)
Subjective: 2 Days Post-Op Procedure(s) (LRB): LEFT TOTAL HIP ARTHROPLASTY ANTERIOR APPROACH (Left) Patient reports pain as moderate.    Objective: Vital signs in last 24 hours: Temp:  [98.7 F (37.1 C)-100.8 F (38.2 C)] 98.7 F (37.1 C) (12/13 0603) Pulse Rate:  [109-120] 109 (12/13 0603) Resp:  [16-20] 20 (12/13 0603) BP: (135-159)/(78-90) 135/83 (12/13 0603) SpO2:  [96 %-100 %] 100 % (12/13 0603)  Intake/Output from previous day: 12/12 0701 - 12/13 0700 In: 1219.6 [P.O.:600; I.V.:619.6] Out: 2025 [Urine:2025] Intake/Output this shift: Total I/O In: 240 [P.O.:240] Out: 500 [Urine:500]  Recent Labs    07/31/19 0454  HGB 12.2*   Recent Labs    07/31/19 0454  WBC 8.3  RBC 4.04*  HCT 37.9*  PLT 218   Recent Labs    07/31/19 0454  NA 132*  K 4.2  CL 99  CO2 22  BUN 15  CREATININE 0.77  GLUCOSE 184*  CALCIUM 8.5*   No results for input(s): LABPT, INR in the last 72 hours.  Sensation intact distally Intact pulses distally Dorsiflexion/Plantar flexion intact Incision: dressing C/D/I Compartment soft   Assessment/Plan: 2 Days Post-Op Procedure(s) (LRB): LEFT TOTAL HIP ARTHROPLASTY ANTERIOR APPROACH (Left) Up with therapy Discharge home with home health this afternoon.      Mcarthur Rossetti 08/01/2019, 10:17 AM

## 2019-08-02 ENCOUNTER — Telehealth: Payer: Self-pay | Admitting: *Deleted

## 2019-08-02 ENCOUNTER — Encounter: Payer: Self-pay | Admitting: *Deleted

## 2019-08-02 NOTE — Telephone Encounter (Signed)
Ortho bundle D/C call completed. 

## 2019-08-02 NOTE — Care Plan (Signed)
Call to patient to check status after discharge home from hospital on 08/01/19. Patient reports he is doing well. Spoke with him and his wife. Reviewed all medications prescribed by Dr. Ninfa Linden at discharge. Has not yet heard from [place], but informed to call back to [facility] if they do not call today. Overall, patient states he has been getting up, moving around and doing well.

## 2019-08-06 ENCOUNTER — Telehealth: Payer: Self-pay | Admitting: *Deleted

## 2019-08-06 NOTE — Telephone Encounter (Signed)
Attempted 7 day Ortho bundle call to patient. Left VM requesting call back. Will call again on Monday to re-try to reach if don't hear from him today.

## 2019-08-10 ENCOUNTER — Telehealth: Payer: Self-pay | Admitting: *Deleted

## 2019-08-10 NOTE — Telephone Encounter (Signed)
Ortho bundle call completed- Patient called in response to CM's VM left for 7 day post-op call.

## 2019-08-10 NOTE — Telephone Encounter (Signed)
Ortho bundle D/C call completed- Left 3 voice messages for patient on Friday, 08/06/19, 08/09/19, and 08/10/19.

## 2019-08-11 ENCOUNTER — Other Ambulatory Visit: Payer: Self-pay

## 2019-08-11 ENCOUNTER — Encounter: Payer: Self-pay | Admitting: Orthopaedic Surgery

## 2019-08-11 ENCOUNTER — Telehealth: Payer: Self-pay | Admitting: *Deleted

## 2019-08-11 ENCOUNTER — Ambulatory Visit (INDEPENDENT_AMBULATORY_CARE_PROVIDER_SITE_OTHER): Payer: Medicare Other | Admitting: Orthopaedic Surgery

## 2019-08-11 DIAGNOSIS — Z96642 Presence of left artificial hip joint: Secondary | ICD-10-CM

## 2019-08-11 NOTE — Progress Notes (Signed)
Patient is a 78 year old gentleman who is 12 days status post a left total hip arthroplasty.  We have replaced his right hip before as well as 2 years ago.  He says he is doing well overall.  He is not really have much pain.  He is ambulate with a cane at home therapy has been doing a good job with him.  On exam his hip incision looks good on the left side.  I remove staples in place Steri-Strips.  I did drain about 15 cc seroma from the hip.  There is no evidence of infection.  His calf are soft and his leg lengths are equal.  At this point he will continue increase his activities as comfort allows.  I would like to see him back in about a month to see how he is doing overall but no x-rays are needed.  All question concerns were answered and addressed.  He will go down from twice a day baby aspirin to once a day for a week and then can stop the baby aspirin unless he was on it before.

## 2019-08-11 NOTE — Telephone Encounter (Signed)
14 day Ortho bundle in office meeting/call completed.

## 2019-08-11 NOTE — Care Plan (Signed)
RNCM met with patient in office during his 2 week initial post-op appointment with Dr. Ninfa Linden. He is ambulating with a cane today and states he is really having no pain, just some tightness around the incision. Dr. Ninfa Linden drained about 15 ml of a seroma just beside the incision line. Steri-strips reinforced today. Per Dr. Ninfa Linden, patient can drive if not using any pain medication and can decrease aspirin to once a day. Updated patient's wife, who was in the car waiting for him. F/U with Dr. Ninfa Linden in 4 weeks. 1 session of home health PT remaining this week and he will complete therapy.

## 2019-09-02 ENCOUNTER — Telehealth: Payer: Self-pay | Admitting: *Deleted

## 2019-09-02 NOTE — Telephone Encounter (Signed)
30 day Ortho bundle call and survey completed. 

## 2019-09-08 ENCOUNTER — Ambulatory Visit (INDEPENDENT_AMBULATORY_CARE_PROVIDER_SITE_OTHER): Payer: Medicare Other | Admitting: Orthopaedic Surgery

## 2019-09-08 ENCOUNTER — Encounter: Payer: Self-pay | Admitting: Orthopaedic Surgery

## 2019-09-08 ENCOUNTER — Other Ambulatory Visit: Payer: Self-pay

## 2019-09-08 DIAGNOSIS — Z96642 Presence of left artificial hip joint: Secondary | ICD-10-CM

## 2019-09-08 NOTE — Progress Notes (Signed)
HPI: [name] returns today almost 6 weeks status post left total hip arthroplasty.  He is doing well.  He carries a cane most of the time just in case he "needs it".  He is having  pain in the hip.  Some stiffness in his hamstrings first thing in the morning.  Physical exam: General well-developed well-nourished male no acute distress.  Left hip: Surgical incisions well-healed no seroma.  No evidence of infection.  Left calf supple nontender.  Slightly limited internal and external rotation of left hip without pain.    Dorsiflexion plantarflexion intact left ankle.  Impression: Status post left total hip arthroplasty 07/30/2019  Plan: He will continue to work on range of motion strengthening scar tissue mobilization.  We will see him back in December 2021 at time obtain an AP pelvis and lateral view of the left hip.  We will see him back sooner if he has any questions or concerns.

## 2019-09-30 DIAGNOSIS — M542 Cervicalgia: Secondary | ICD-10-CM | POA: Diagnosis not present

## 2019-11-05 ENCOUNTER — Telehealth: Payer: Self-pay | Admitting: *Deleted

## 2019-11-05 NOTE — Telephone Encounter (Signed)
90 day Ortho bundle call completed. 

## 2019-11-09 ENCOUNTER — Telehealth: Payer: Self-pay | Admitting: *Deleted

## 2019-11-09 NOTE — Telephone Encounter (Signed)
90 day Ortho bundle call completed. 

## 2019-12-31 DIAGNOSIS — N400 Enlarged prostate without lower urinary tract symptoms: Secondary | ICD-10-CM | POA: Diagnosis not present

## 2019-12-31 DIAGNOSIS — K219 Gastro-esophageal reflux disease without esophagitis: Secondary | ICD-10-CM | POA: Diagnosis not present

## 2019-12-31 DIAGNOSIS — M169 Osteoarthritis of hip, unspecified: Secondary | ICD-10-CM | POA: Diagnosis not present

## 2019-12-31 DIAGNOSIS — E782 Mixed hyperlipidemia: Secondary | ICD-10-CM | POA: Diagnosis not present

## 2019-12-31 DIAGNOSIS — I10 Essential (primary) hypertension: Secondary | ICD-10-CM | POA: Diagnosis not present

## 2019-12-31 DIAGNOSIS — E1129 Type 2 diabetes mellitus with other diabetic kidney complication: Secondary | ICD-10-CM | POA: Diagnosis not present

## 2019-12-31 DIAGNOSIS — H9190 Unspecified hearing loss, unspecified ear: Secondary | ICD-10-CM | POA: Diagnosis not present

## 2019-12-31 DIAGNOSIS — Z79899 Other long term (current) drug therapy: Secondary | ICD-10-CM | POA: Diagnosis not present

## 2019-12-31 DIAGNOSIS — Z0000 Encounter for general adult medical examination without abnormal findings: Secondary | ICD-10-CM | POA: Diagnosis not present

## 2019-12-31 DIAGNOSIS — R801 Persistent proteinuria, unspecified: Secondary | ICD-10-CM | POA: Diagnosis not present

## 2020-01-06 DIAGNOSIS — K219 Gastro-esophageal reflux disease without esophagitis: Secondary | ICD-10-CM | POA: Diagnosis not present

## 2020-01-06 DIAGNOSIS — E1129 Type 2 diabetes mellitus with other diabetic kidney complication: Secondary | ICD-10-CM | POA: Diagnosis not present

## 2020-01-06 DIAGNOSIS — I10 Essential (primary) hypertension: Secondary | ICD-10-CM | POA: Diagnosis not present

## 2020-01-06 DIAGNOSIS — Z7984 Long term (current) use of oral hypoglycemic drugs: Secondary | ICD-10-CM | POA: Diagnosis not present

## 2020-01-06 DIAGNOSIS — H35033 Hypertensive retinopathy, bilateral: Secondary | ICD-10-CM | POA: Diagnosis not present

## 2020-01-06 DIAGNOSIS — N400 Enlarged prostate without lower urinary tract symptoms: Secondary | ICD-10-CM | POA: Diagnosis not present

## 2020-01-06 DIAGNOSIS — R809 Proteinuria, unspecified: Secondary | ICD-10-CM | POA: Diagnosis not present

## 2020-01-06 DIAGNOSIS — Z86010 Personal history of colonic polyps: Secondary | ICD-10-CM | POA: Diagnosis not present

## 2020-01-06 DIAGNOSIS — Z79899 Other long term (current) drug therapy: Secondary | ICD-10-CM | POA: Diagnosis not present

## 2020-01-06 DIAGNOSIS — E782 Mixed hyperlipidemia: Secondary | ICD-10-CM | POA: Diagnosis not present

## 2020-03-13 DIAGNOSIS — E119 Type 2 diabetes mellitus without complications: Secondary | ICD-10-CM | POA: Diagnosis not present

## 2020-03-13 DIAGNOSIS — H26493 Other secondary cataract, bilateral: Secondary | ICD-10-CM | POA: Diagnosis not present

## 2020-03-13 DIAGNOSIS — H43813 Vitreous degeneration, bilateral: Secondary | ICD-10-CM | POA: Diagnosis not present

## 2020-03-13 DIAGNOSIS — H35033 Hypertensive retinopathy, bilateral: Secondary | ICD-10-CM | POA: Diagnosis not present

## 2020-03-13 DIAGNOSIS — Z961 Presence of intraocular lens: Secondary | ICD-10-CM | POA: Diagnosis not present

## 2020-03-16 DIAGNOSIS — R1310 Dysphagia, unspecified: Secondary | ICD-10-CM | POA: Diagnosis not present

## 2020-03-16 DIAGNOSIS — Z86010 Personal history of colonic polyps: Secondary | ICD-10-CM | POA: Diagnosis not present

## 2020-03-16 DIAGNOSIS — Z8719 Personal history of other diseases of the digestive system: Secondary | ICD-10-CM | POA: Diagnosis not present

## 2020-03-16 DIAGNOSIS — K219 Gastro-esophageal reflux disease without esophagitis: Secondary | ICD-10-CM | POA: Diagnosis not present

## 2020-04-12 DIAGNOSIS — L723 Sebaceous cyst: Secondary | ICD-10-CM | POA: Diagnosis not present

## 2020-04-16 DIAGNOSIS — L723 Sebaceous cyst: Secondary | ICD-10-CM | POA: Diagnosis not present

## 2020-04-21 DIAGNOSIS — L089 Local infection of the skin and subcutaneous tissue, unspecified: Secondary | ICD-10-CM | POA: Diagnosis not present

## 2020-04-21 DIAGNOSIS — L723 Sebaceous cyst: Secondary | ICD-10-CM | POA: Diagnosis not present

## 2020-04-21 DIAGNOSIS — Z23 Encounter for immunization: Secondary | ICD-10-CM | POA: Diagnosis not present

## 2020-04-28 DIAGNOSIS — E11319 Type 2 diabetes mellitus with unspecified diabetic retinopathy without macular edema: Secondary | ICD-10-CM | POA: Diagnosis not present

## 2020-04-28 DIAGNOSIS — L723 Sebaceous cyst: Secondary | ICD-10-CM | POA: Diagnosis not present

## 2020-04-28 DIAGNOSIS — L089 Local infection of the skin and subcutaneous tissue, unspecified: Secondary | ICD-10-CM | POA: Diagnosis not present

## 2020-05-04 DIAGNOSIS — L723 Sebaceous cyst: Secondary | ICD-10-CM | POA: Diagnosis not present

## 2020-05-11 DIAGNOSIS — E1129 Type 2 diabetes mellitus with other diabetic kidney complication: Secondary | ICD-10-CM | POA: Diagnosis not present

## 2020-05-11 DIAGNOSIS — R112 Nausea with vomiting, unspecified: Secondary | ICD-10-CM | POA: Diagnosis not present

## 2020-05-11 DIAGNOSIS — R82998 Other abnormal findings in urine: Secondary | ICD-10-CM | POA: Diagnosis not present

## 2020-05-23 DIAGNOSIS — Z1159 Encounter for screening for other viral diseases: Secondary | ICD-10-CM | POA: Diagnosis not present

## 2020-05-26 DIAGNOSIS — K648 Other hemorrhoids: Secondary | ICD-10-CM | POA: Diagnosis not present

## 2020-05-26 DIAGNOSIS — R1310 Dysphagia, unspecified: Secondary | ICD-10-CM | POA: Diagnosis not present

## 2020-05-26 DIAGNOSIS — D124 Benign neoplasm of descending colon: Secondary | ICD-10-CM | POA: Diagnosis not present

## 2020-05-26 DIAGNOSIS — K5289 Other specified noninfective gastroenteritis and colitis: Secondary | ICD-10-CM | POA: Diagnosis not present

## 2020-05-26 DIAGNOSIS — K635 Polyp of colon: Secondary | ICD-10-CM | POA: Diagnosis not present

## 2020-05-26 DIAGNOSIS — Z86010 Personal history of colonic polyps: Secondary | ICD-10-CM | POA: Diagnosis not present

## 2020-05-26 DIAGNOSIS — K2930 Chronic superficial gastritis without bleeding: Secondary | ICD-10-CM | POA: Diagnosis not present

## 2020-05-26 DIAGNOSIS — K224 Dyskinesia of esophagus: Secondary | ICD-10-CM | POA: Diagnosis not present

## 2020-05-29 DIAGNOSIS — M25552 Pain in left hip: Secondary | ICD-10-CM | POA: Diagnosis not present

## 2020-06-01 DIAGNOSIS — K635 Polyp of colon: Secondary | ICD-10-CM | POA: Diagnosis not present

## 2020-06-01 DIAGNOSIS — K2930 Chronic superficial gastritis without bleeding: Secondary | ICD-10-CM | POA: Diagnosis not present

## 2020-06-01 DIAGNOSIS — D124 Benign neoplasm of descending colon: Secondary | ICD-10-CM | POA: Diagnosis not present

## 2020-06-01 DIAGNOSIS — K5289 Other specified noninfective gastroenteritis and colitis: Secondary | ICD-10-CM | POA: Diagnosis not present

## 2020-06-02 IMAGING — RF DG HIP (WITH PELVIS) OPERATIVE*L*
1 series · 9 of 9 positions shown · non-contrast
Comparison: 06/30/2019

CLINICAL DATA: Order for left total hip replacement Total fluoro
time 12 secs

EXAM:
OPERATIVE LEFT HIP (WITH PELVIS IF PERFORMED) to VIEWS
TECHNIQUE: Fluoroscopic spot image(s) were submitted for interpretation
post-operatively.

[Series 1: unknown protocol · 0.20mm/px · 9 of 9 slices shown]
[im 1/9]
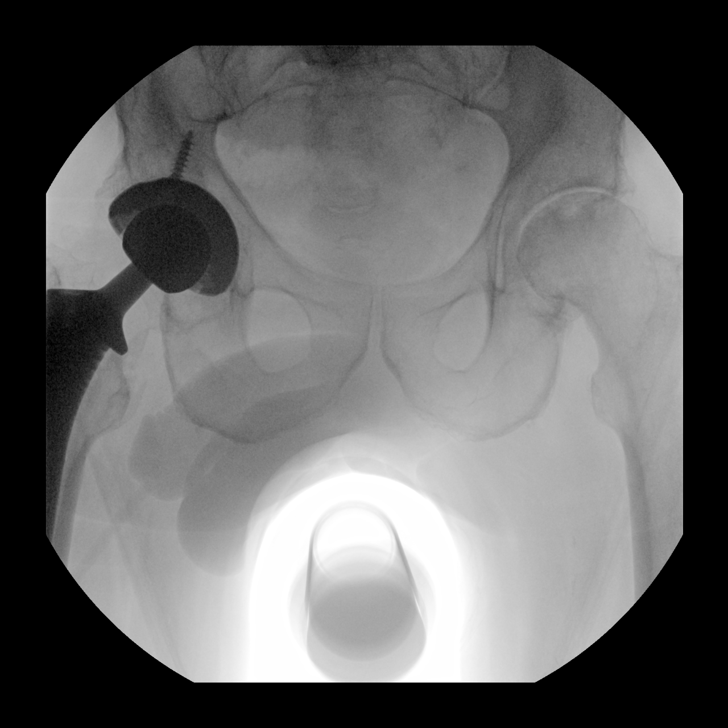
[im 2/9]
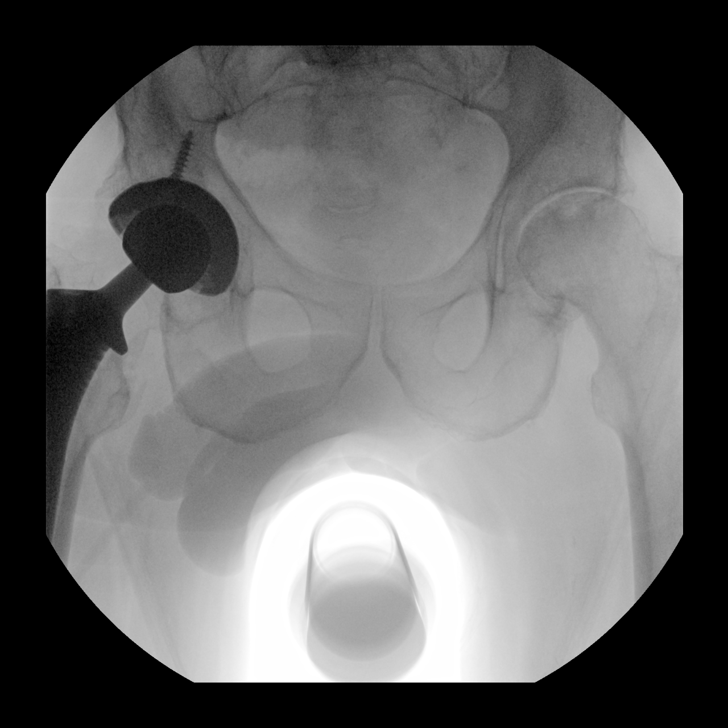
[im 3/9]
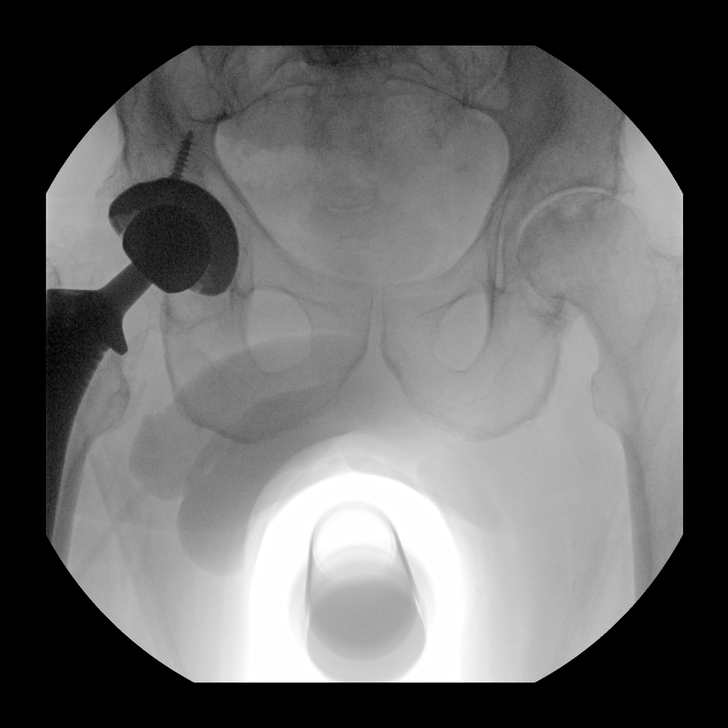
[im 4/9]
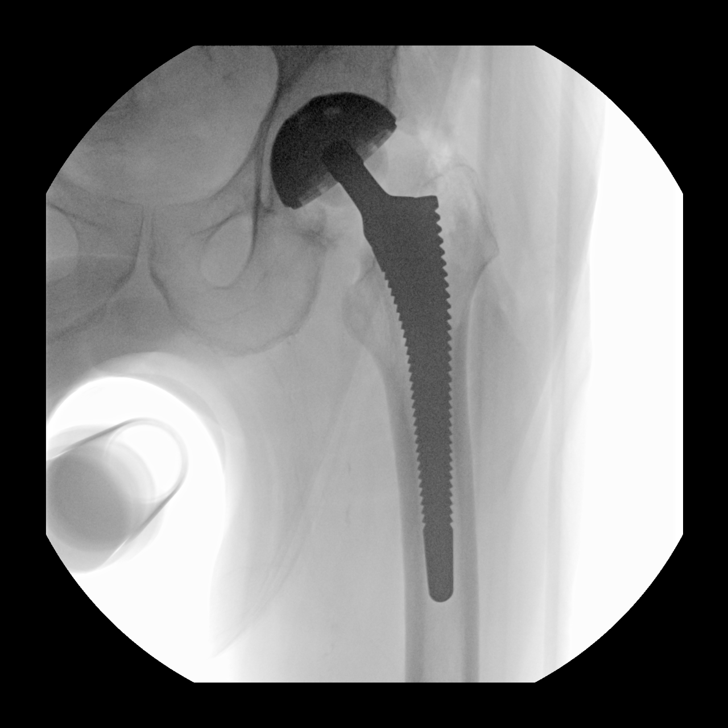
[im 5/9]
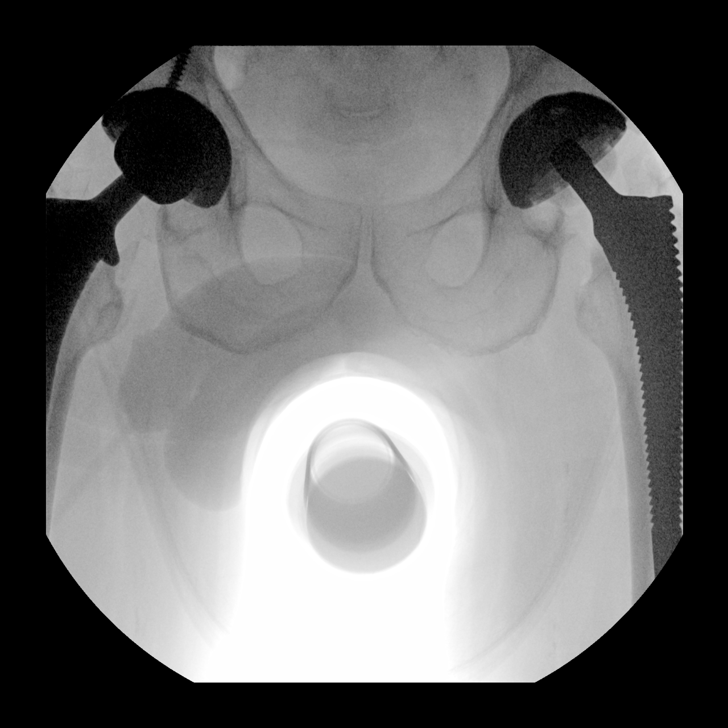
[im 6/9]
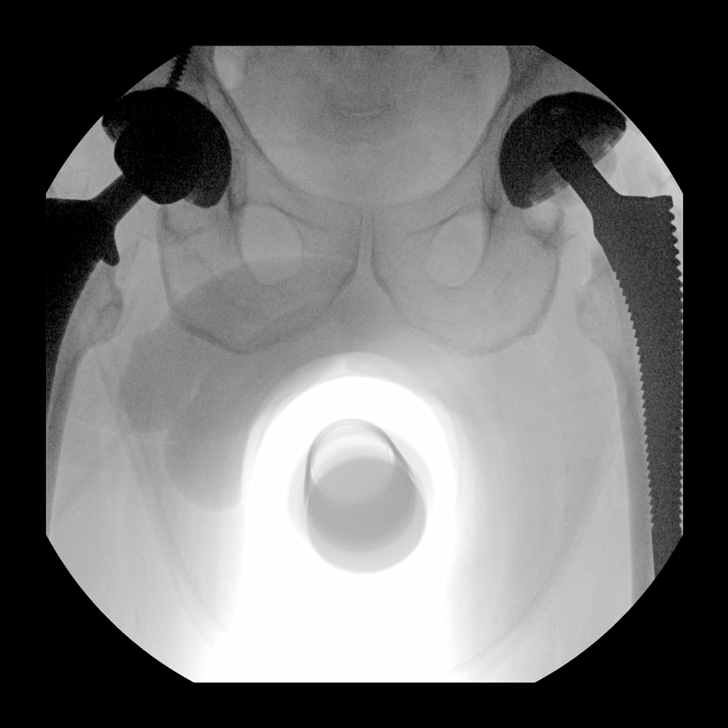
[im 7/9]
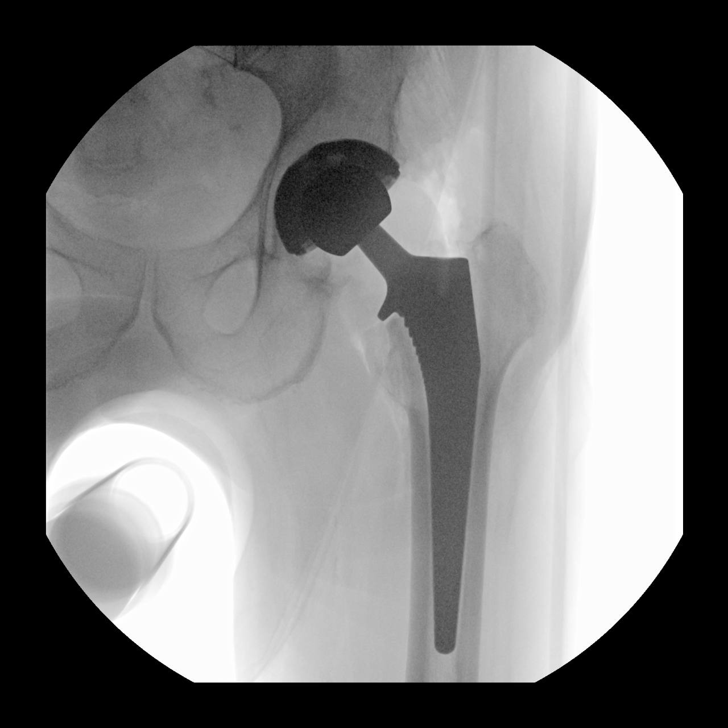
[im 8/9]
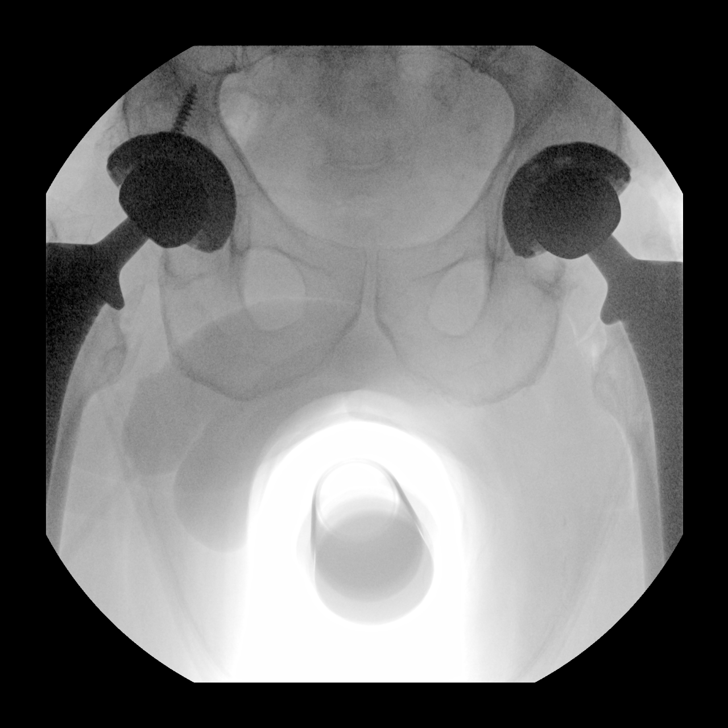
[im 9/9]
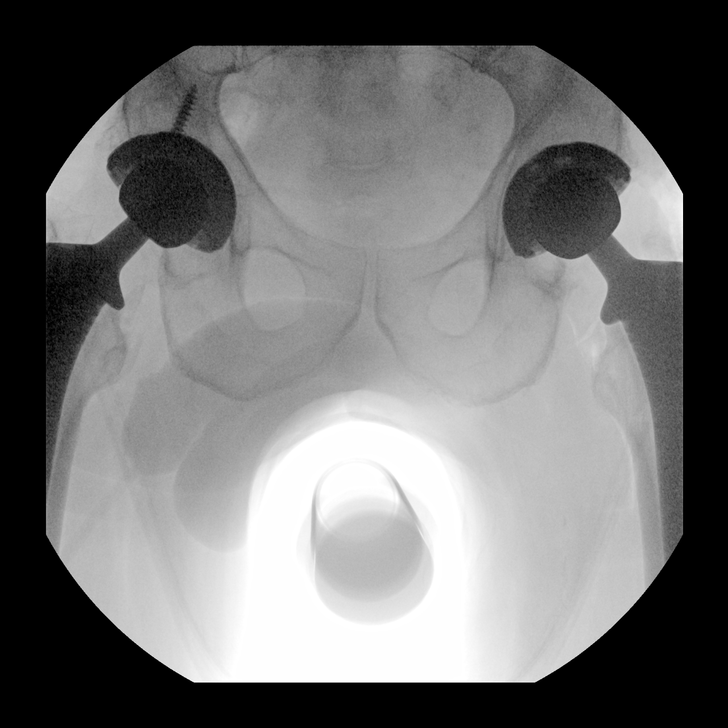

[9 of 9 positions shown; findings below may reference images not displayed]

FINDINGS: Interval a total LEFT hip arthroplasty. No complicating features.
RIGHT arthroplasty noted
IMPRESSION: Post LEFT hip arthroplasty without complication.

## 2020-06-17 ENCOUNTER — Ambulatory Visit: Payer: Federal, State, Local not specified - PPO | Attending: Internal Medicine

## 2020-06-17 DIAGNOSIS — Z23 Encounter for immunization: Secondary | ICD-10-CM

## 2020-06-17 NOTE — Progress Notes (Signed)
   Covid-19 Vaccination Clinic  Name:  Terryn Redner    MRN: [id number] DOB: [birth year]  06/17/2020  Mr. Canterbury was observed post Covid-19 immunization for 15 minutes without incident. He was provided with Vaccine Information Sheet and instruction to access the V-Safe system.   Mr. Maddy was instructed to call 911 with any severe reactions post vaccine: Marland Kitchen Difficulty breathing  . Swelling of face and throat  . A fast heartbeat  . A bad rash all over body  . Dizziness and weakness

## 2020-07-05 DIAGNOSIS — Z79899 Other long term (current) drug therapy: Secondary | ICD-10-CM | POA: Diagnosis not present

## 2020-07-05 DIAGNOSIS — R112 Nausea with vomiting, unspecified: Secondary | ICD-10-CM | POA: Diagnosis not present

## 2020-07-05 DIAGNOSIS — E1129 Type 2 diabetes mellitus with other diabetic kidney complication: Secondary | ICD-10-CM | POA: Diagnosis not present

## 2020-07-05 DIAGNOSIS — E782 Mixed hyperlipidemia: Secondary | ICD-10-CM | POA: Diagnosis not present

## 2020-07-05 DIAGNOSIS — H35033 Hypertensive retinopathy, bilateral: Secondary | ICD-10-CM | POA: Diagnosis not present

## 2020-07-05 DIAGNOSIS — R82998 Other abnormal findings in urine: Secondary | ICD-10-CM | POA: Diagnosis not present

## 2020-07-05 DIAGNOSIS — I10 Essential (primary) hypertension: Secondary | ICD-10-CM | POA: Diagnosis not present

## 2020-07-05 DIAGNOSIS — K219 Gastro-esophageal reflux disease without esophagitis: Secondary | ICD-10-CM | POA: Diagnosis not present

## 2020-07-05 DIAGNOSIS — R809 Proteinuria, unspecified: Secondary | ICD-10-CM | POA: Diagnosis not present

## 2020-07-05 DIAGNOSIS — N400 Enlarged prostate without lower urinary tract symptoms: Secondary | ICD-10-CM | POA: Diagnosis not present

## 2020-07-10 DIAGNOSIS — Z86010 Personal history of colonic polyps: Secondary | ICD-10-CM | POA: Diagnosis not present

## 2020-07-10 DIAGNOSIS — E782 Mixed hyperlipidemia: Secondary | ICD-10-CM | POA: Diagnosis not present

## 2020-07-10 DIAGNOSIS — K219 Gastro-esophageal reflux disease without esophagitis: Secondary | ICD-10-CM | POA: Diagnosis not present

## 2020-07-10 DIAGNOSIS — I10 Essential (primary) hypertension: Secondary | ICD-10-CM | POA: Diagnosis not present

## 2020-07-10 DIAGNOSIS — N400 Enlarged prostate without lower urinary tract symptoms: Secondary | ICD-10-CM | POA: Diagnosis not present

## 2020-07-10 DIAGNOSIS — Z79899 Other long term (current) drug therapy: Secondary | ICD-10-CM | POA: Diagnosis not present

## 2020-07-10 DIAGNOSIS — H35033 Hypertensive retinopathy, bilateral: Secondary | ICD-10-CM | POA: Diagnosis not present

## 2020-07-10 DIAGNOSIS — K5289 Other specified noninfective gastroenteritis and colitis: Secondary | ICD-10-CM | POA: Diagnosis not present

## 2020-07-10 DIAGNOSIS — R809 Proteinuria, unspecified: Secondary | ICD-10-CM | POA: Diagnosis not present

## 2020-07-10 DIAGNOSIS — E1129 Type 2 diabetes mellitus with other diabetic kidney complication: Secondary | ICD-10-CM | POA: Diagnosis not present

## 2020-07-10 DIAGNOSIS — K2930 Chronic superficial gastritis without bleeding: Secondary | ICD-10-CM | POA: Diagnosis not present

## 2020-08-07 ENCOUNTER — Encounter: Payer: Self-pay | Admitting: Orthopaedic Surgery

## 2020-08-07 ENCOUNTER — Other Ambulatory Visit: Payer: Self-pay

## 2020-08-07 ENCOUNTER — Telehealth: Payer: Self-pay | Admitting: *Deleted

## 2020-08-07 ENCOUNTER — Ambulatory Visit (INDEPENDENT_AMBULATORY_CARE_PROVIDER_SITE_OTHER): Payer: Medicare Other

## 2020-08-07 ENCOUNTER — Ambulatory Visit (INDEPENDENT_AMBULATORY_CARE_PROVIDER_SITE_OTHER): Payer: Medicare Other | Admitting: Orthopaedic Surgery

## 2020-08-07 DIAGNOSIS — Z96642 Presence of left artificial hip joint: Secondary | ICD-10-CM

## 2020-08-07 DIAGNOSIS — M5441 Lumbago with sciatica, right side: Secondary | ICD-10-CM

## 2020-08-07 DIAGNOSIS — G8929 Other chronic pain: Secondary | ICD-10-CM

## 2020-08-07 NOTE — Telephone Encounter (Signed)
Ortho bundle 1 year call completed. Survey completed.

## 2020-08-07 NOTE — Progress Notes (Signed)
Office Visit Note   Patient: Alex Carlson           Date of Birth: 1941-06-12           MRN: [id number] Visit Date: 08/07/2020              Requested by: Cari Caraway, [place],  [place] [address] PCP: Cari Caraway, MD   Assessment & Plan: Visit Diagnoses:  1. History of left hip replacement   2. Chronic right-sided low back pain with right-sided sciatica     Plan: Given the severity of his right-sided radicular symptoms and his plain film findings, we are recommending a MRI of her lumbar spine to rule out any nerve compression to the right side and to assess the degree of stenosis.  He is already been through therapy for his back and has had medications that have not helped.  He continues to have such significant radicular symptoms we need this MRI to help determine the best treatment plan for him.  He agrees with this assessment and plan.  He will call for follow-up appointment once he knows the date of his MRI.  Follow-Up Instructions: No follow-ups on file.   Orders:  Orders Placed This Encounter  Procedures   XR HIP UNILAT W OR W/O PELVIS 1V LEFT   XR Lumbar Spine 2-3 Views   No orders of the defined types were placed in this encounter.     Procedures: No procedures performed   Clinical Data: No additional findings.   Subjective: Chief Complaint  Patient presents with   Left Hip - Follow-up  The patient is well-known to me.  He has had both his hips replaced.  His left one was the one done more recently about a year ago.  His biggest complaint now is significant right-sided sciatica.  He seen his primary care physician for this before as well and sent to [place] for evaluation treatment of his right-sided sciatica.  He is a diabetic but reports good control.  He has pain in his ischial area and sciatic area that radiates all the way down to his foot.  He has numbness on the lateral aspect of his left leg but he does not have on his right he  states.  He denies any injuries.  He is never had back surgery he states.  He says his hips are doing well with no pain in the groin.  He does not need an assistive device to walk.  HPI  Review of Systems He currently denies any headache, chest pain, shortness of breath, fever, chills, nausea, vomiting  Objective: Vital Signs: There were no vitals taken for this visit.  Physical Exam He is alert and oriented x3 and in no acute distress Ortho Exam Examination of both his hips show the move smoothly and fluidly.  On the right side he does have a positive straight leg raise.  There is also numbness in the L4 and L5 distribution on the right but is not in the left. Specialty Comments:  No specialty comments available.  Imaging: XR HIP UNILAT W OR W/O PELVIS 1V LEFT  Result Date: 08/07/2020 An AP pelvis and lateral of the left hip shows bilateral total hip arthroplasties with no complicating features.  XR Lumbar Spine 2-3 Views  Result Date: 08/07/2020 Views of the lumbar spine shows severe degenerative changes at multiple levels throughout the lumbar spine.    PMFS History: Patient Active Problem List   Diagnosis Date  Noted   Status post total hip replacement, left 07/31/2019   Status post total replacement of left hip 07/30/2019   Unilateral primary osteoarthritis, left hip 06/25/2017   Unilateral primary osteoarthritis, right hip 10/08/2016   Status post total replacement of right hip 10/08/2016   Past Medical History:  Diagnosis Date   Arthritis    "hips" (10/09/2016)   GERD (gastroesophageal reflux disease)    High cholesterol    Hypertension    Type II diabetes mellitus ([place])     History reviewed. No pertinent family history.  Past Surgical History:  Procedure Laterality Date   COLONOSCOPY     JOINT REPLACEMENT     TOTAL HIP ARTHROPLASTY Right 10/08/2016   Procedure: RIGHT TOTAL HIP ARTHROPLASTY ANTERIOR APPROACH;  Surgeon: Mcarthur Rossetti, MD;   Location: [facility];  Service: Orthopedics;  Laterality: Right;   TOTAL HIP ARTHROPLASTY Left 07/30/2019   Procedure: LEFT TOTAL HIP ARTHROPLASTY ANTERIOR APPROACH;  Surgeon: Mcarthur Rossetti, MD;  Location: [facility];  Service: Orthopedics;  Laterality: Left;   Social History   Occupational History   Not on file  Tobacco Use   Smoking status: Former Smoker    Years: 40.00    Types: Cigarettes   Smokeless tobacco: Never Used   Tobacco comment: 10/09/2016 "stopped smoking in the mid 1990s; only smoked on the weekends"  Vaping Use   Vaping Use: Never used  Substance and Sexual Activity   Alcohol use: Yes    Alcohol/week: 4.0 standard drinks    Types: 4 Shots of liquor per week    Comment: occ   Drug use: No   Sexual activity: Yes

## 2020-08-18 DIAGNOSIS — J029 Acute pharyngitis, unspecified: Secondary | ICD-10-CM | POA: Diagnosis not present

## 2020-08-31 ENCOUNTER — Other Ambulatory Visit: Payer: Self-pay

## 2020-08-31 ENCOUNTER — Ambulatory Visit
Admission: RE | Admit: 2020-08-31 | Discharge: 2020-08-31 | Disposition: A | Discharge status: Home / Self Care | Payer: Medicare Other | Source: Ambulatory Visit | Attending: Orthopaedic Surgery | Admitting: Orthopaedic Surgery

## 2020-08-31 DIAGNOSIS — G8929 Other chronic pain: Secondary | ICD-10-CM

## 2020-08-31 DIAGNOSIS — Z20822 Contact with and (suspected) exposure to covid-19: Secondary | ICD-10-CM | POA: Diagnosis not present

## 2020-08-31 DIAGNOSIS — M48061 Spinal stenosis, lumbar region without neurogenic claudication: Secondary | ICD-10-CM | POA: Diagnosis not present

## 2020-09-04 ENCOUNTER — Ambulatory Visit: Payer: Medicare Other | Admitting: Orthopaedic Surgery

## 2020-09-13 ENCOUNTER — Encounter: Payer: Self-pay | Admitting: Orthopaedic Surgery

## 2020-09-13 ENCOUNTER — Ambulatory Visit (INDEPENDENT_AMBULATORY_CARE_PROVIDER_SITE_OTHER): Payer: Medicare Other | Admitting: Orthopaedic Surgery

## 2020-09-13 DIAGNOSIS — M5441 Lumbago with sciatica, right side: Secondary | ICD-10-CM | POA: Diagnosis not present

## 2020-09-13 DIAGNOSIS — G8929 Other chronic pain: Secondary | ICD-10-CM

## 2020-09-13 NOTE — Progress Notes (Signed)
Mr. Nee is an 80 year old gentleman who comes in to go over a MRI of his lumbar spine.  He has been having right-sided low back pain with radicular symptoms that runs into the sciatic region and down his leg.  Those symptoms have been persistent.  He had been on Tylenol arthritis and want to stop this the pain and symptoms did seem to radiate to the right side.  This did necessitate MRI.  He walks without assistive device but does have a limp.  He still has the same sciatic symptoms.  There is no weakness on my exam of his right lower extremity but a positive straight leg raise to the right side.  The MRI is reviewed with him.  It does show severe foraminal stenosis especially to the right side at L4-L5 and L5-S1.  There is significant canal stenosis at L2-L3 and L3-L4.  I would like to send him to Dr. Ernestina Patches for a right-sided epidural steroid injection most likely at L4-L5.  He has had at least experience with Dr. Ernestina Patches before with hip injections so he agrees with this referral.  Once he has an intervention in his lumbar spine, he can be referred back to me a few weeks later.  All questions and concerns were answered and addressed.

## 2020-09-14 ENCOUNTER — Other Ambulatory Visit: Payer: Self-pay

## 2020-09-14 DIAGNOSIS — G8929 Other chronic pain: Secondary | ICD-10-CM

## 2020-10-03 ENCOUNTER — Ambulatory Visit: Payer: Self-pay

## 2020-10-03 ENCOUNTER — Encounter: Payer: Self-pay | Admitting: Physical Medicine and Rehabilitation

## 2020-10-03 ENCOUNTER — Ambulatory Visit (INDEPENDENT_AMBULATORY_CARE_PROVIDER_SITE_OTHER): Payer: Medicare Other | Admitting: Physical Medicine and Rehabilitation

## 2020-10-03 ENCOUNTER — Other Ambulatory Visit: Payer: Self-pay

## 2020-10-03 VITALS — BP 159/91 | HR 112

## 2020-10-03 DIAGNOSIS — E11319 Type 2 diabetes mellitus with unspecified diabetic retinopathy without macular edema: Secondary | ICD-10-CM | POA: Insufficient documentation

## 2020-10-03 DIAGNOSIS — I739 Peripheral vascular disease, unspecified: Secondary | ICD-10-CM | POA: Insufficient documentation

## 2020-10-03 DIAGNOSIS — M5416 Radiculopathy, lumbar region: Secondary | ICD-10-CM

## 2020-10-03 DIAGNOSIS — E1121 Type 2 diabetes mellitus with diabetic nephropathy: Secondary | ICD-10-CM | POA: Insufficient documentation

## 2020-10-03 DIAGNOSIS — B360 Pityriasis versicolor: Secondary | ICD-10-CM | POA: Insufficient documentation

## 2020-10-03 DIAGNOSIS — K219 Gastro-esophageal reflux disease without esophagitis: Secondary | ICD-10-CM | POA: Insufficient documentation

## 2020-10-03 DIAGNOSIS — E782 Mixed hyperlipidemia: Secondary | ICD-10-CM | POA: Insufficient documentation

## 2020-10-03 DIAGNOSIS — H9190 Unspecified hearing loss, unspecified ear: Secondary | ICD-10-CM | POA: Insufficient documentation

## 2020-10-03 DIAGNOSIS — N139 Obstructive and reflux uropathy, unspecified: Secondary | ICD-10-CM | POA: Insufficient documentation

## 2020-10-03 DIAGNOSIS — I10 Essential (primary) hypertension: Secondary | ICD-10-CM | POA: Insufficient documentation

## 2020-10-03 DIAGNOSIS — N400 Enlarged prostate without lower urinary tract symptoms: Secondary | ICD-10-CM | POA: Insufficient documentation

## 2020-10-03 MED ORDER — METHYLPREDNISOLONE ACETATE 80 MG/ML IJ SUSP
80.0000 mg | Freq: Once | INTRAMUSCULAR | Status: AC
  Administered 2020-10-03: 80 mg

## 2020-10-03 NOTE — Patient Instructions (Signed)

## 2020-10-03 NOTE — Progress Notes (Signed)
Right lateral calf pain worse with walking. Some tenderness in right buttock when sitting. Pain in leg has been present for 2-3 years. Numeric Pain Rating Scale and Functional Assessment Average Pain 6   In the last MONTH (on 0-10 scale) has pain interfered with the following?  1. General activity like being  able to carry out your everyday physical activities such as walking, climbing stairs, carrying groceries, or moving a chair?  Rating(5)   +Driver, -BT, -Dye Allergies.

## 2020-10-03 NOTE — Procedures (Signed)
Lumbar Epidural Steroid Injection - Interlaminar Approach with Fluoroscopic Guidance  Patient: Alex Carlson      Date of Birth: [birth year] MRN: [id number] PCP: Cari Caraway, MD      Visit Date: 10/03/2020   Universal Protocol:     Consent Given By: the patient  Position: PRONE  Additional Comments: Vital signs were monitored before and after the procedure. Patient was prepped and draped in the usual sterile fashion. The correct patient, procedure, and site was verified.   Injection Procedure Details:   Procedure diagnoses: Lumbar radiculopathy [M54.16]   Meds Administered:  Meds ordered this encounter  Medications  . methylPREDNISolone acetate (DEPO-MEDROL) injection 80 mg     Laterality: Right  Location/Site:  L4-L5  Needle: 3.5 in., 20 ga. Tuohy  Needle Placement: Paramedian epidural  Findings:   -Comments: Excellent flow of contrast into the epidural space.  Procedure Details: Using a paramedian approach from the side mentioned above, the region overlying the inferior lamina was localized under fluoroscopic visualization and the soft tissues overlying this structure were infiltrated with 4 ml. of 1% Lidocaine without Epinephrine. The Tuohy needle was inserted into the epidural space using a paramedian approach.   The epidural space was localized using loss of resistance along with counter oblique bi-planar fluoroscopic views.  After negative aspirate for air, blood, and CSF, a 2 ml. volume of Isovue-250 was injected into the epidural space and the flow of contrast was observed. Radiographs were obtained for documentation purposes.    The injectate was administered into the level noted above.   Additional Comments:  The patient tolerated the procedure well Dressing: 2 x 2 sterile gauze and Band-Aid    Post-procedure details: Patient was observed during the procedure. Post-procedure instructions were reviewed.  Patient left the clinic in stable  condition.

## 2020-10-03 NOTE — Progress Notes (Signed)
Alex Carlson - 80 y.o. male MRN [id number]  Date of birth: [birth year]  Office Visit Note: Visit Date: 10/03/2020 PCP: Cari Caraway, MD Referred by: Cari Caraway, MD  Subjective: Chief Complaint  Patient presents with  . Lower Back - Pain  . Right Lower Leg - Pain   HPI:  Alex Carlson is a 80 y.o. male who comes in today at the request of Dr. Jean Rosenthal for planned Right L4-L5 Lumbar epidural steroid injection with fluoroscopic guidance.  The patient has failed conservative care including home exercise, medications, time and activity modification.  This injection will be diagnostic and hopefully therapeutic.  Please see requesting physician notes for further details and justification.  MRI reviewed with images and spine model.  MRI reviewed in the note below.   ROS Otherwise per HPI.  Assessment & Plan: Visit Diagnoses:    ICD-10-CM   1. Lumbar radiculopathy  M54.16 XR C-ARM NO REPORT    Epidural Steroid injection    methylPREDNISolone acetate (DEPO-MEDROL) injection 80 mg    Plan: No additional findings.   Meds & Orders:  Meds ordered this encounter  Medications  . methylPREDNISolone acetate (DEPO-MEDROL) injection 80 mg    Orders Placed This Encounter  Procedures  . XR C-ARM NO REPORT  . Epidural Steroid injection    Follow-up: Return in about 4 weeks (around 10/31/2020) for Jean Rosenthal, MD.   Procedures: No procedures performed  Lumbar Epidural Steroid Injection - Interlaminar Approach with Fluoroscopic Guidance  Patient: Alex Carlson      Date of Birth: [birth year] MRN: [id number] PCP: Cari Caraway, MD      Visit Date: 10/03/2020   Universal Protocol:     Consent Given By: the patient  Position: PRONE  Additional Comments: Vital signs were monitored before and after the procedure. Patient was prepped and draped in the usual sterile fashion. The correct patient, procedure, and site was verified.   Injection Procedure  Details:   Procedure diagnoses: Lumbar radiculopathy [M54.16]   Meds Administered:  Meds ordered this encounter  Medications  . methylPREDNISolone acetate (DEPO-MEDROL) injection 80 mg     Laterality: Right  Location/Site:  L4-L5  Needle: 3.5 in., 20 ga. Tuohy  Needle Placement: Paramedian epidural  Findings:   -Comments: Excellent flow of contrast into the epidural space.  Procedure Details: Using a paramedian approach from the side mentioned above, the region overlying the inferior lamina was localized under fluoroscopic visualization and the soft tissues overlying this structure were infiltrated with 4 ml. of 1% Lidocaine without Epinephrine. The Tuohy needle was inserted into the epidural space using a paramedian approach.   The epidural space was localized using loss of resistance along with counter oblique bi-planar fluoroscopic views.  After negative aspirate for air, blood, and CSF, a 2 ml. volume of Isovue-250 was injected into the epidural space and the flow of contrast was observed. Radiographs were obtained for documentation purposes.    The injectate was administered into the level noted above.   Additional Comments:  The patient tolerated the procedure well Dressing: 2 x 2 sterile gauze and Band-Aid    Post-procedure details: Patient was observed during the procedure. Post-procedure instructions were reviewed.  Patient left the clinic in stable condition.     Clinical History: MRI LUMBAR SPINE WITHOUT CONTRAST  TECHNIQUE: Multiplanar, multisequence MR imaging of the lumbar spine was performed. No intravenous contrast was administered.  COMPARISON:  Lumbar radiographs 08/07/2020.  FINDINGS: Segmentation: Standard. The inferior-most fully formed intervertebral disc is labeled  L5-S1.  Alignment: Slight retrolisthesis of L2 on L3 and L5 on S1. Lower lumbar levocurvature centered at L4-L5 with compensatory dextrocurvature centered at  L2-L3.  Vertebrae: Vertebral body heights are maintained. Mild degenerative endplate signal changes about the L2-L3 and L5-S1 discs. Small vertebral venous malformations. Otherwise, no focal marrow signal abnormality to suggest acute fracture, discitis/osteomyelitis, or suspicious bone lesion.  Conus medullaris and cauda equina: Conus extends to the L1-L2 level. The conus is normal signal.  Paraspinal and other soft tissues: Unremarkable.  Disc levels:  T12-L1: No significant disc protrusion, foraminal stenosis, or canal stenosis.  L1-L2: Disc bulging, facet hypertrophy, and ligamentum flavum thickening with bilateral foraminal disc protrusions. Resulting moderate canal stenosis and mild bilateral foraminal stenosis.  L2-L3: Mildly left eccentric broad-based disc bulge, bilateral facet hypertrophy, and ligamentum flavum thickening. Resulting severe canal stenosis with severe left subarticular recess stenosis. Moderate left and mild right foraminal stenosis.  L3-L4: Mildly left eccentric broad disc bulge, bilateral facet hypertrophy, and ligamentum flavum thickening. Resulting severe canal stenosis. Severe left greater than right foraminal stenosis.  L4-L5: Right eccentric broad disc bulge and right greater than left facet hypertrophy with ligamentum flavum thickening. Resulting mild canal stenosis with moderate right greater than left subarticular recess narrowing. Severe right and moderate left foraminal stenosis.  L5-S1: Broad disc bulge and bilateral facet hypertrophy. Resulting severe right and mild left foraminal stenosis. No significant canal stenosis.  IMPRESSION: 1. Severe canal stenosis at L2-L3 and L3-L4. Moderate canal stenosis at L1-L2 and mild canal stenosis at L4-L5. 2. Severe foraminal stenosis bilaterally at L3-L4 and on the right at L4-L5 and L5-S1. Moderate foraminal stenosis on the left at L2-L3 and L4-L5. Multilevel mild foraminal stenosis  is detailed above.   Electronically Signed   By: Margaretha Sheffield MD   On: 08/31/2020 08:19     Objective:  VS:  HT:    WT:   BMI:     BP:(!) 159/91  HR:(!) 112bpm  TEMP: ( )  RESP:  Physical Exam Vitals and nursing note reviewed.  Constitutional:      General: He is not in acute distress.    Appearance: Normal appearance. He is not ill-appearing.  HENT:     Head: Normocephalic and atraumatic.     Right Ear: External ear normal.     Left Ear: External ear normal.     Nose: No congestion.  Eyes:     Extraocular Movements: Extraocular movements intact.  Cardiovascular:     Rate and Rhythm: Normal rate.     Pulses: Normal pulses.  Pulmonary:     Effort: Pulmonary effort is normal. No respiratory distress.  Abdominal:     General: There is no distension.     Palpations: Abdomen is soft.  Musculoskeletal:        General: No tenderness or signs of injury.     Cervical back: Neck supple.     Right lower leg: No edema.     Left lower leg: No edema.     Comments: Patient has good distal strength without clonus.  Skin:    Findings: No erythema or rash.  Neurological:     General: No focal deficit present.     Mental Status: He is alert and oriented to person, place, and time.     Sensory: No sensory deficit.     Motor: No weakness or abnormal muscle tone.     Coordination: Coordination normal.  Psychiatric:        Mood and  Affect: Mood normal.        Behavior: Behavior normal.      Imaging: No results found.

## 2020-12-10 DIAGNOSIS — Z03818 Encounter for observation for suspected exposure to other biological agents ruled out: Secondary | ICD-10-CM | POA: Diagnosis not present

## 2021-01-02 DIAGNOSIS — Z125 Encounter for screening for malignant neoplasm of prostate: Secondary | ICD-10-CM | POA: Diagnosis not present

## 2021-01-02 DIAGNOSIS — J029 Acute pharyngitis, unspecified: Secondary | ICD-10-CM | POA: Diagnosis not present

## 2021-01-02 DIAGNOSIS — R82998 Other abnormal findings in urine: Secondary | ICD-10-CM | POA: Diagnosis not present

## 2021-01-02 DIAGNOSIS — E1165 Type 2 diabetes mellitus with hyperglycemia: Secondary | ICD-10-CM | POA: Diagnosis not present

## 2021-01-02 DIAGNOSIS — R112 Nausea with vomiting, unspecified: Secondary | ICD-10-CM | POA: Diagnosis not present

## 2021-01-02 DIAGNOSIS — Z7984 Long term (current) use of oral hypoglycemic drugs: Secondary | ICD-10-CM | POA: Diagnosis not present

## 2021-01-02 DIAGNOSIS — N400 Enlarged prostate without lower urinary tract symptoms: Secondary | ICD-10-CM | POA: Diagnosis not present

## 2021-01-02 DIAGNOSIS — H35033 Hypertensive retinopathy, bilateral: Secondary | ICD-10-CM | POA: Diagnosis not present

## 2021-01-02 DIAGNOSIS — I10 Essential (primary) hypertension: Secondary | ICD-10-CM | POA: Diagnosis not present

## 2021-01-02 DIAGNOSIS — E1129 Type 2 diabetes mellitus with other diabetic kidney complication: Secondary | ICD-10-CM | POA: Diagnosis not present

## 2021-01-02 DIAGNOSIS — Z79899 Other long term (current) drug therapy: Secondary | ICD-10-CM | POA: Diagnosis not present

## 2021-01-02 DIAGNOSIS — E782 Mixed hyperlipidemia: Secondary | ICD-10-CM | POA: Diagnosis not present

## 2021-01-05 DIAGNOSIS — R809 Proteinuria, unspecified: Secondary | ICD-10-CM | POA: Diagnosis not present

## 2021-01-05 DIAGNOSIS — E782 Mixed hyperlipidemia: Secondary | ICD-10-CM | POA: Diagnosis not present

## 2021-01-05 DIAGNOSIS — K219 Gastro-esophageal reflux disease without esophagitis: Secondary | ICD-10-CM | POA: Diagnosis not present

## 2021-01-05 DIAGNOSIS — E1129 Type 2 diabetes mellitus with other diabetic kidney complication: Secondary | ICD-10-CM | POA: Diagnosis not present

## 2021-01-05 DIAGNOSIS — N400 Enlarged prostate without lower urinary tract symptoms: Secondary | ICD-10-CM | POA: Diagnosis not present

## 2021-01-05 DIAGNOSIS — I10 Essential (primary) hypertension: Secondary | ICD-10-CM | POA: Diagnosis not present

## 2021-01-05 DIAGNOSIS — Z7984 Long term (current) use of oral hypoglycemic drugs: Secondary | ICD-10-CM | POA: Diagnosis not present

## 2021-01-09 DIAGNOSIS — M179 Osteoarthritis of knee, unspecified: Secondary | ICD-10-CM | POA: Diagnosis not present

## 2021-01-09 DIAGNOSIS — E782 Mixed hyperlipidemia: Secondary | ICD-10-CM | POA: Diagnosis not present

## 2021-01-09 DIAGNOSIS — E11319 Type 2 diabetes mellitus with unspecified diabetic retinopathy without macular edema: Secondary | ICD-10-CM | POA: Diagnosis not present

## 2021-01-09 DIAGNOSIS — N400 Enlarged prostate without lower urinary tract symptoms: Secondary | ICD-10-CM | POA: Diagnosis not present

## 2021-01-09 DIAGNOSIS — M169 Osteoarthritis of hip, unspecified: Secondary | ICD-10-CM | POA: Diagnosis not present

## 2021-01-09 DIAGNOSIS — K219 Gastro-esophageal reflux disease without esophagitis: Secondary | ICD-10-CM | POA: Diagnosis not present

## 2021-01-09 DIAGNOSIS — I10 Essential (primary) hypertension: Secondary | ICD-10-CM | POA: Diagnosis not present

## 2021-01-09 DIAGNOSIS — H35033 Hypertensive retinopathy, bilateral: Secondary | ICD-10-CM | POA: Diagnosis not present

## 2021-01-09 DIAGNOSIS — E1129 Type 2 diabetes mellitus with other diabetic kidney complication: Secondary | ICD-10-CM | POA: Diagnosis not present

## 2021-03-14 DIAGNOSIS — H35033 Hypertensive retinopathy, bilateral: Secondary | ICD-10-CM | POA: Diagnosis not present

## 2021-03-14 DIAGNOSIS — E119 Type 2 diabetes mellitus without complications: Secondary | ICD-10-CM | POA: Diagnosis not present

## 2021-03-14 DIAGNOSIS — H43813 Vitreous degeneration, bilateral: Secondary | ICD-10-CM | POA: Diagnosis not present

## 2021-03-14 DIAGNOSIS — H26493 Other secondary cataract, bilateral: Secondary | ICD-10-CM | POA: Diagnosis not present

## 2021-03-14 DIAGNOSIS — Z961 Presence of intraocular lens: Secondary | ICD-10-CM | POA: Diagnosis not present

## 2021-06-28 DIAGNOSIS — Z96642 Presence of left artificial hip joint: Secondary | ICD-10-CM | POA: Diagnosis not present

## 2021-06-28 DIAGNOSIS — Z96641 Presence of right artificial hip joint: Secondary | ICD-10-CM | POA: Diagnosis not present

## 2021-06-28 DIAGNOSIS — I10 Essential (primary) hypertension: Secondary | ICD-10-CM | POA: Diagnosis not present

## 2021-06-28 DIAGNOSIS — Z23 Encounter for immunization: Secondary | ICD-10-CM | POA: Diagnosis not present

## 2021-07-05 IMAGING — MR MR LUMBAR SPINE W/O CM
4 of 5 series · 26 of 48 positions shown · non-contrast
Comparison: Lumbar radiographs 08/07/2020.

EXAM:
MRI LUMBAR SPINE WITHOUT CONTRAST
TECHNIQUE: Multiplanar, multisequence MR imaging of the lumbar spine was
performed. No intravenous contrast was administered.

[Series 3: T2 · sagittal · 4.0mm · 1.09mm/px · 6 of 17 slices shown (1 of 2)]
[im 1/17]
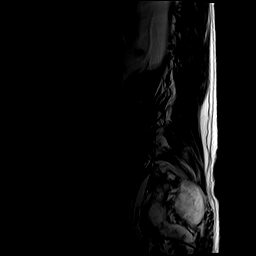
[im 4/17]
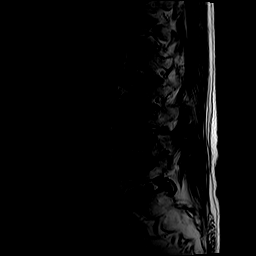
[im 7/17]
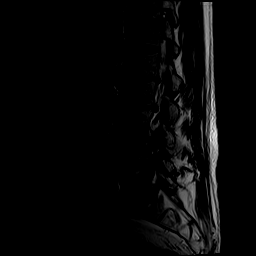
[im 10/17]
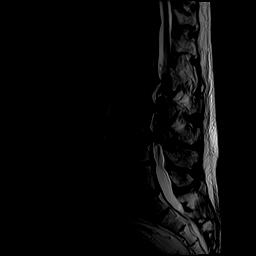
[im 13/17]
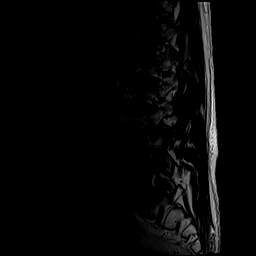
[im 17/17]
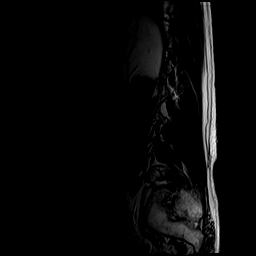

[Series 5: T1 · sagittal · 4.0mm · 1.09mm/px · 6 of 17 slices shown (1 of 2)]
[im 1/17]
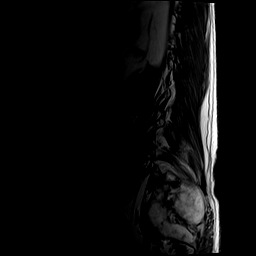
[im 4/17]
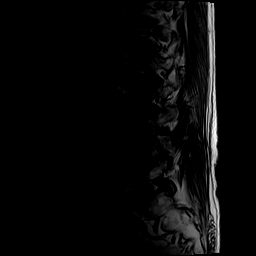
[im 7/17]
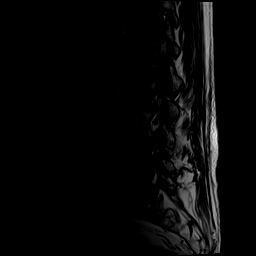
[im 10/17]
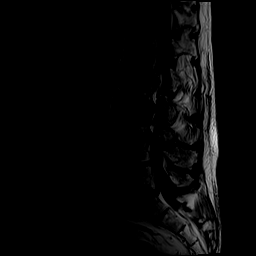
[im 13/17]
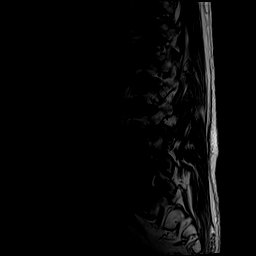
[im 17/17]
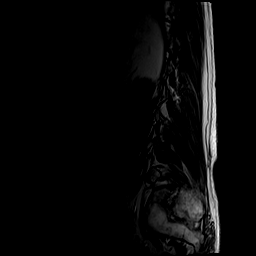

[Series 6: T2 · axial · 4.0mm · 0.39mm/px · z∈[-51,+156]mm · 9 of 40 slices shown (2 of 2)]
[im 1/40]
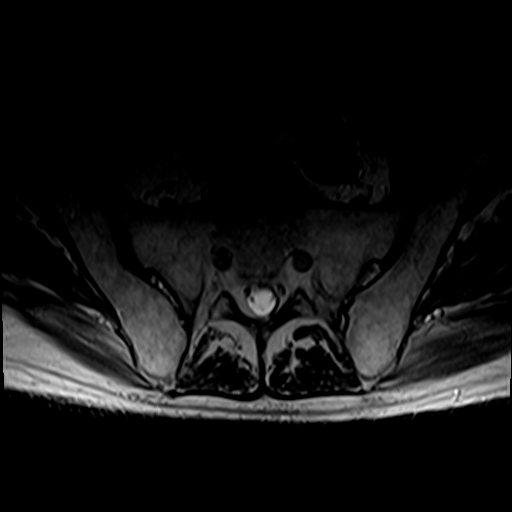
[im 6/40]
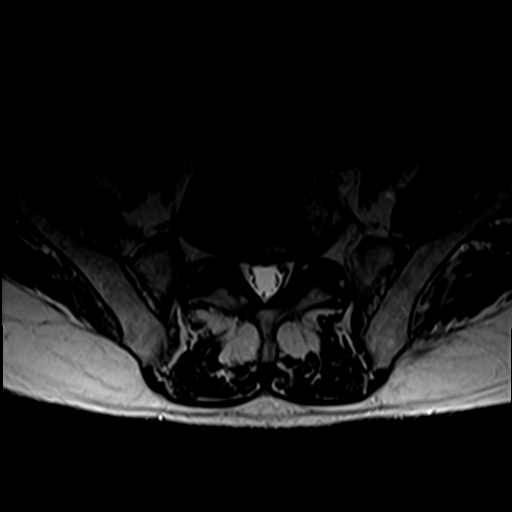
[im 12/40]
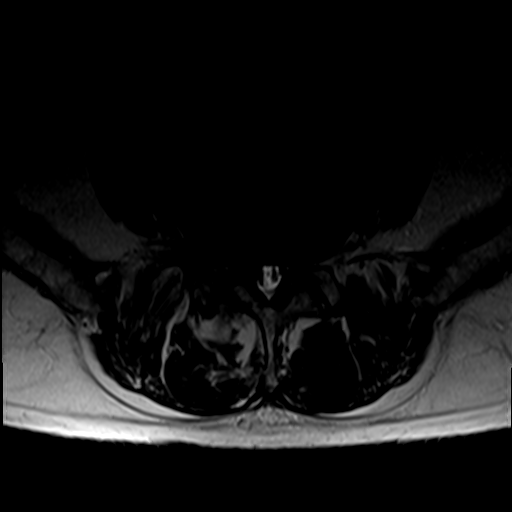
[im 17/40]
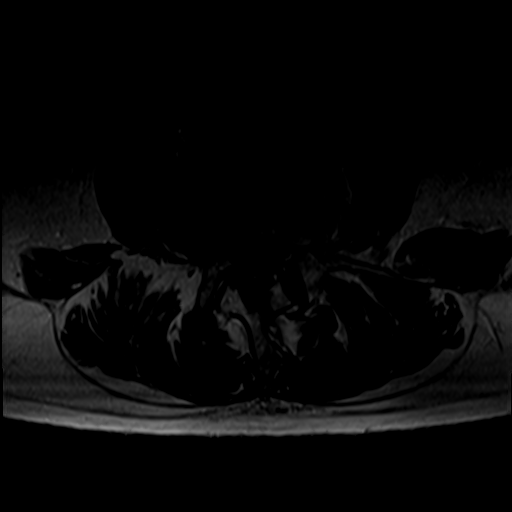
[im 20/40]
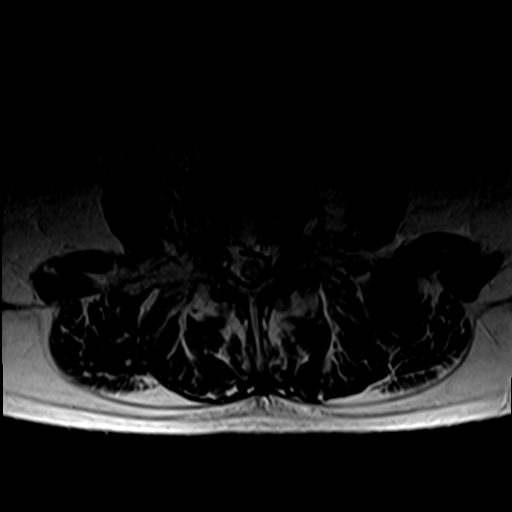
[im 23/40]
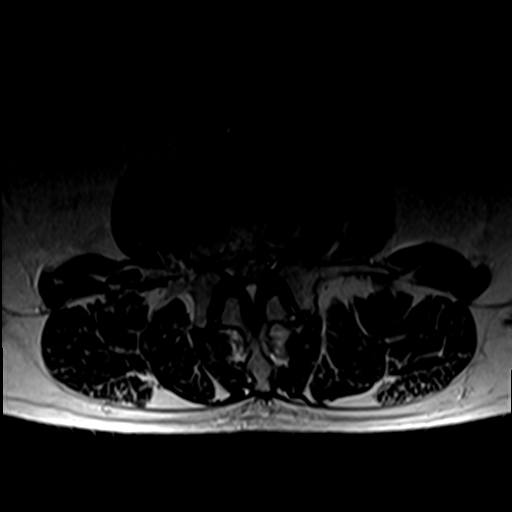
[im 28/40]
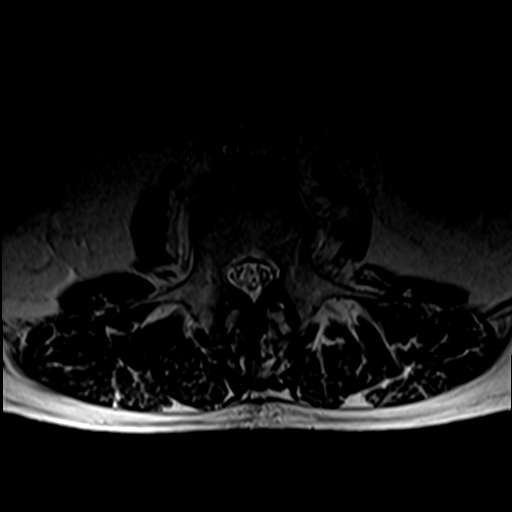
[im 34/40]
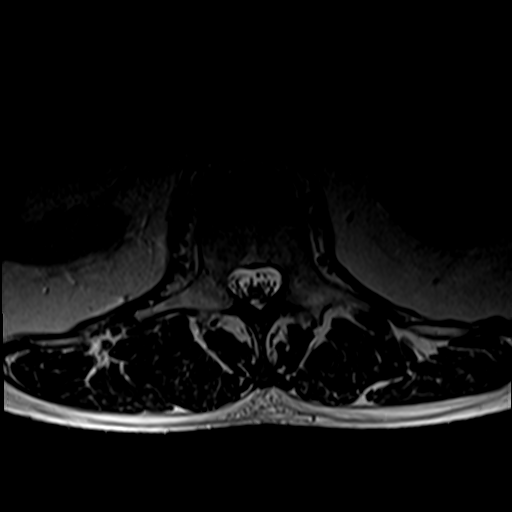
[im 40/40]
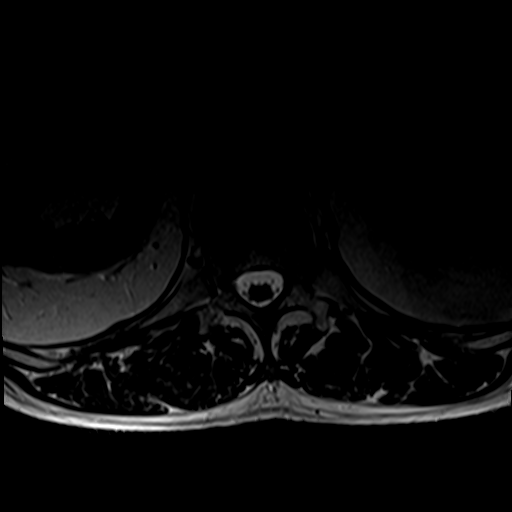

[Series 7: T1 · axial · 4.0mm · 0.39mm/px · z∈[-51,+127]mm · 5 of 40 slices shown (2 of 2)]
[im 1/40]
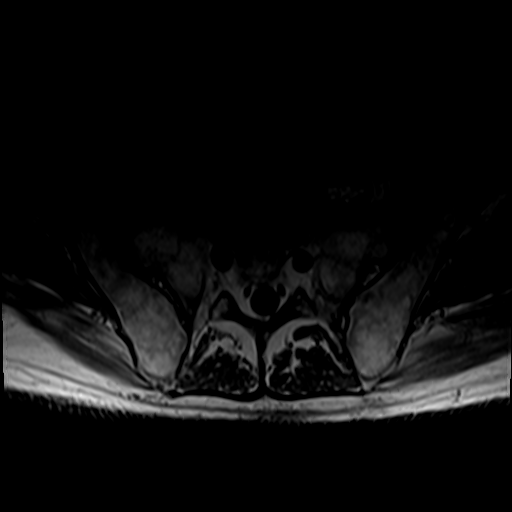
[im 6/40]
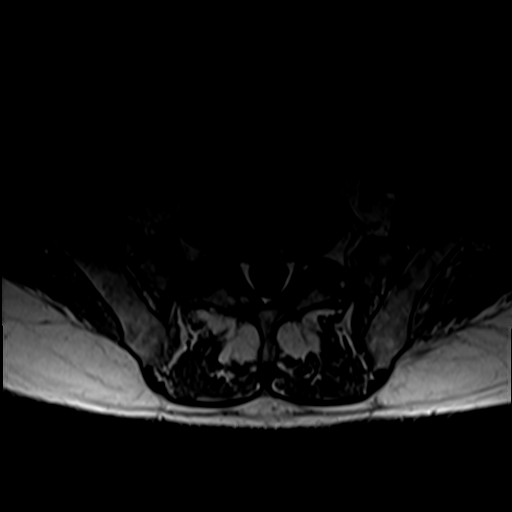
[im 12/40]
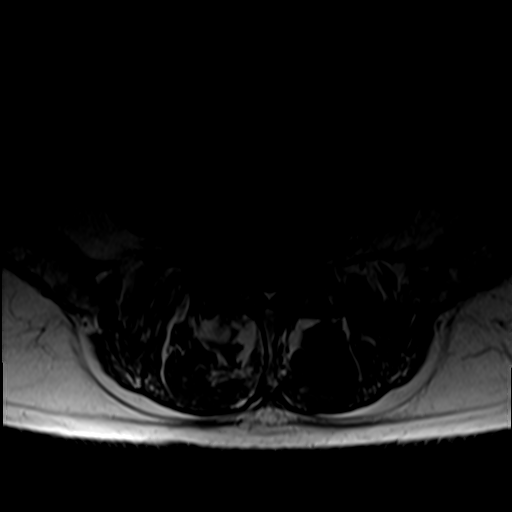
[im 20/40]
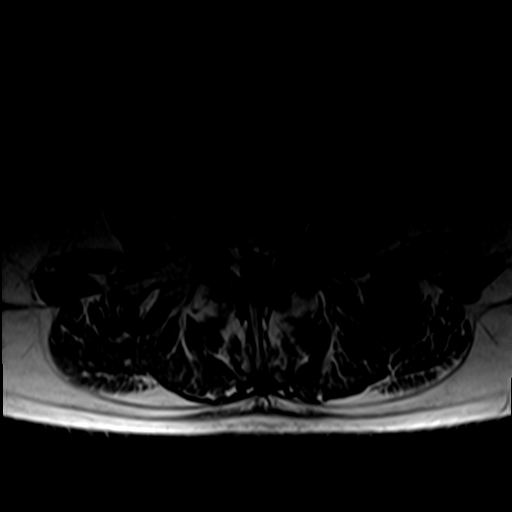
[im 34/40]
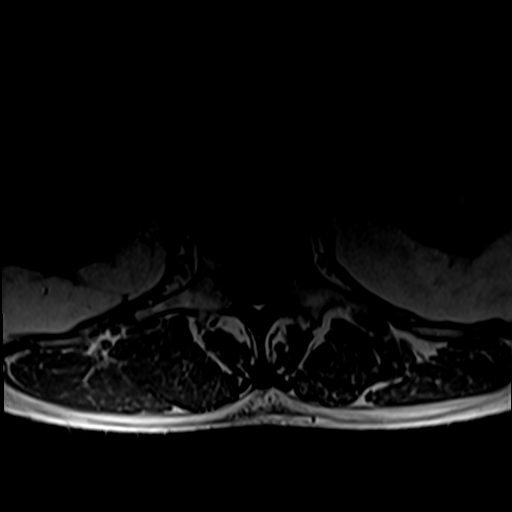

[26 of 48 positions shown; findings below may reference images not displayed]

FINDINGS: Segmentation: Standard. The inferior-most fully formed
intervertebral disc is labeled L5-S1.

Alignment: Slight retrolisthesis of L2 on L3 and L5 on S1. Lower
lumbar levocurvature centered at L4-L5 with compensatory
dextrocurvature centered at L2-L3.

Vertebrae: Vertebral body heights are maintained. Mild degenerative
endplate signal changes about the L2-L3 and L5-S1 discs. Small
vertebral venous malformations. Otherwise, no focal marrow signal
abnormality to suggest acute fracture, discitis/osteomyelitis, or
suspicious bone lesion.

Conus medullaris and cauda equina: Conus extends to the L1-L2 level.
The conus is normal signal.

Paraspinal and other soft tissues: Unremarkable.

Disc levels:

T12-L1: No significant disc protrusion, foraminal stenosis, or canal
stenosis.

L1-L2: Disc bulging, facet hypertrophy, and ligamentum flavum
thickening with bilateral foraminal disc protrusions. Resulting
moderate canal stenosis and mild bilateral foraminal stenosis.

L2-L3: Mildly left eccentric broad-based disc bulge, bilateral facet
hypertrophy, and ligamentum flavum thickening. Resulting severe
canal stenosis with severe left subarticular recess stenosis.
Moderate left and mild right foraminal stenosis.

L3-L4: Mildly left eccentric broad disc bulge, bilateral facet
hypertrophy, and ligamentum flavum thickening. Resulting severe
canal stenosis. Severe left greater than right foraminal stenosis.

L4-L5: Right eccentric broad disc bulge and right greater than left
facet hypertrophy with ligamentum flavum thickening. Resulting mild
canal stenosis with moderate right greater than left subarticular
recess narrowing. Severe right and moderate left foraminal stenosis.

L5-S1: Broad disc bulge and bilateral facet hypertrophy. Resulting
severe right and mild left foraminal stenosis. No significant canal
stenosis.
IMPRESSION: 1. Severe canal stenosis at L2-L3 and L3-L4. Moderate canal stenosis
at L1-L2 and mild canal stenosis at L4-L5.
2. Severe foraminal stenosis bilaterally at L3-L4 and on the right
at L4-L5 and L5-S1. Moderate foraminal stenosis on the left at L2-L3
and L4-L5. Multilevel mild foraminal stenosis is detailed above.

## 2021-07-09 DIAGNOSIS — E1129 Type 2 diabetes mellitus with other diabetic kidney complication: Secondary | ICD-10-CM | POA: Diagnosis not present

## 2021-07-09 DIAGNOSIS — E11319 Type 2 diabetes mellitus with unspecified diabetic retinopathy without macular edema: Secondary | ICD-10-CM | POA: Diagnosis not present

## 2021-07-09 DIAGNOSIS — K219 Gastro-esophageal reflux disease without esophagitis: Secondary | ICD-10-CM | POA: Diagnosis not present

## 2021-07-09 DIAGNOSIS — E782 Mixed hyperlipidemia: Secondary | ICD-10-CM | POA: Diagnosis not present

## 2021-07-09 DIAGNOSIS — I10 Essential (primary) hypertension: Secondary | ICD-10-CM | POA: Diagnosis not present

## 2021-07-09 DIAGNOSIS — N401 Enlarged prostate with lower urinary tract symptoms: Secondary | ICD-10-CM | POA: Diagnosis not present

## 2021-07-10 DIAGNOSIS — Z7984 Long term (current) use of oral hypoglycemic drugs: Secondary | ICD-10-CM | POA: Diagnosis not present

## 2021-07-10 DIAGNOSIS — E1165 Type 2 diabetes mellitus with hyperglycemia: Secondary | ICD-10-CM | POA: Diagnosis not present

## 2021-07-10 DIAGNOSIS — E1129 Type 2 diabetes mellitus with other diabetic kidney complication: Secondary | ICD-10-CM | POA: Diagnosis not present

## 2021-07-10 DIAGNOSIS — R82998 Other abnormal findings in urine: Secondary | ICD-10-CM | POA: Diagnosis not present

## 2021-07-10 DIAGNOSIS — R809 Proteinuria, unspecified: Secondary | ICD-10-CM | POA: Diagnosis not present

## 2021-07-19 DIAGNOSIS — I10 Essential (primary) hypertension: Secondary | ICD-10-CM | POA: Diagnosis not present

## 2021-07-19 DIAGNOSIS — K219 Gastro-esophageal reflux disease without esophagitis: Secondary | ICD-10-CM | POA: Diagnosis not present

## 2021-07-19 DIAGNOSIS — E782 Mixed hyperlipidemia: Secondary | ICD-10-CM | POA: Diagnosis not present

## 2021-07-19 DIAGNOSIS — N400 Enlarged prostate without lower urinary tract symptoms: Secondary | ICD-10-CM | POA: Diagnosis not present

## 2021-07-19 DIAGNOSIS — R809 Proteinuria, unspecified: Secondary | ICD-10-CM | POA: Diagnosis not present

## 2021-07-19 DIAGNOSIS — Z0000 Encounter for general adult medical examination without abnormal findings: Secondary | ICD-10-CM | POA: Diagnosis not present

## 2021-07-19 DIAGNOSIS — Z7984 Long term (current) use of oral hypoglycemic drugs: Secondary | ICD-10-CM | POA: Diagnosis not present

## 2021-07-19 DIAGNOSIS — E1129 Type 2 diabetes mellitus with other diabetic kidney complication: Secondary | ICD-10-CM | POA: Diagnosis not present

## 2021-07-19 DIAGNOSIS — Z1389 Encounter for screening for other disorder: Secondary | ICD-10-CM | POA: Diagnosis not present

## 2022-01-15 DIAGNOSIS — E782 Mixed hyperlipidemia: Secondary | ICD-10-CM | POA: Diagnosis not present

## 2022-01-15 DIAGNOSIS — E1129 Type 2 diabetes mellitus with other diabetic kidney complication: Secondary | ICD-10-CM | POA: Diagnosis not present

## 2022-01-15 DIAGNOSIS — R809 Proteinuria, unspecified: Secondary | ICD-10-CM | POA: Diagnosis not present

## 2022-01-22 DIAGNOSIS — Z96642 Presence of left artificial hip joint: Secondary | ICD-10-CM | POA: Diagnosis not present

## 2022-01-22 DIAGNOSIS — R809 Proteinuria, unspecified: Secondary | ICD-10-CM | POA: Diagnosis not present

## 2022-01-22 DIAGNOSIS — Z96641 Presence of right artificial hip joint: Secondary | ICD-10-CM | POA: Diagnosis not present

## 2022-01-22 DIAGNOSIS — K219 Gastro-esophageal reflux disease without esophagitis: Secondary | ICD-10-CM | POA: Diagnosis not present

## 2022-01-22 DIAGNOSIS — E1129 Type 2 diabetes mellitus with other diabetic kidney complication: Secondary | ICD-10-CM | POA: Diagnosis not present

## 2022-01-22 DIAGNOSIS — I10 Essential (primary) hypertension: Secondary | ICD-10-CM | POA: Diagnosis not present

## 2022-01-22 DIAGNOSIS — Z23 Encounter for immunization: Secondary | ICD-10-CM | POA: Diagnosis not present

## 2022-01-22 DIAGNOSIS — E782 Mixed hyperlipidemia: Secondary | ICD-10-CM | POA: Diagnosis not present

## 2022-01-22 DIAGNOSIS — M179 Osteoarthritis of knee, unspecified: Secondary | ICD-10-CM | POA: Diagnosis not present

## 2022-01-22 DIAGNOSIS — N401 Enlarged prostate with lower urinary tract symptoms: Secondary | ICD-10-CM | POA: Diagnosis not present

## 2022-03-18 DIAGNOSIS — Z961 Presence of intraocular lens: Secondary | ICD-10-CM | POA: Diagnosis not present

## 2022-03-18 DIAGNOSIS — H43813 Vitreous degeneration, bilateral: Secondary | ICD-10-CM | POA: Diagnosis not present

## 2022-03-18 DIAGNOSIS — H26493 Other secondary cataract, bilateral: Secondary | ICD-10-CM | POA: Diagnosis not present

## 2022-03-18 DIAGNOSIS — E119 Type 2 diabetes mellitus without complications: Secondary | ICD-10-CM | POA: Diagnosis not present

## 2022-03-18 DIAGNOSIS — H35033 Hypertensive retinopathy, bilateral: Secondary | ICD-10-CM | POA: Diagnosis not present

## 2022-05-02 DIAGNOSIS — Z23 Encounter for immunization: Secondary | ICD-10-CM | POA: Diagnosis not present

## 2022-05-03 ENCOUNTER — Emergency Department (HOSPITAL_COMMUNITY)
Admission: EM | Admit: 2022-05-03 | Discharge: 2022-05-04 | Disposition: A | Discharge status: Home / Self Care | Payer: Medicare Other | Attending: Emergency Medicine | Admitting: Emergency Medicine

## 2022-05-03 ENCOUNTER — Encounter (HOSPITAL_COMMUNITY): Payer: Self-pay | Admitting: Emergency Medicine

## 2022-05-03 ENCOUNTER — Emergency Department (HOSPITAL_COMMUNITY): Payer: Medicare Other

## 2022-05-03 DIAGNOSIS — Z79899 Other long term (current) drug therapy: Secondary | ICD-10-CM | POA: Diagnosis not present

## 2022-05-03 DIAGNOSIS — Z7982 Long term (current) use of aspirin: Secondary | ICD-10-CM | POA: Diagnosis not present

## 2022-05-03 DIAGNOSIS — R071 Chest pain on breathing: Secondary | ICD-10-CM | POA: Diagnosis not present

## 2022-05-03 DIAGNOSIS — R072 Precordial pain: Secondary | ICD-10-CM | POA: Diagnosis not present

## 2022-05-03 DIAGNOSIS — I10 Essential (primary) hypertension: Secondary | ICD-10-CM | POA: Diagnosis not present

## 2022-05-03 DIAGNOSIS — E119 Type 2 diabetes mellitus without complications: Secondary | ICD-10-CM | POA: Diagnosis not present

## 2022-05-03 DIAGNOSIS — Z7984 Long term (current) use of oral hypoglycemic drugs: Secondary | ICD-10-CM | POA: Diagnosis not present

## 2022-05-03 DIAGNOSIS — R079 Chest pain, unspecified: Secondary | ICD-10-CM | POA: Diagnosis not present

## 2022-05-03 LAB — CBC
HCT: 44.8 % (ref 39.0–52.0)
Hemoglobin: 14.6 g/dL (ref 13.0–17.0)
MCH: 29.7 pg (ref 26.0–34.0)
MCHC: 32.6 g/dL (ref 30.0–36.0)
MCV: 91.2 fL (ref 80.0–100.0)
Platelets: 236 10*3/uL (ref 150–400)
RBC: 4.91 MIL/uL (ref 4.22–5.81)
RDW: 13.3 % (ref 11.5–15.5)
WBC: 4.8 10*3/uL (ref 4.0–10.5)
nRBC: 0 % (ref 0.0–0.2)

## 2022-05-03 LAB — BASIC METABOLIC PANEL
Anion gap: 11 (ref 5–15)
BUN: 12 mg/dL (ref 8–23)
CO2: 20 mmol/L — ABNORMAL LOW (ref 22–32)
Calcium: 9.8 mg/dL (ref 8.9–10.3)
Chloride: 106 mmol/L (ref 98–111)
Creatinine, Ser: 0.99 mg/dL (ref 0.61–1.24)
GFR, Estimated: >60 mL/min (ref >60)
Glucose, Bld: 212 mg/dL — ABNORMAL HIGH (ref 70–99)
Potassium: 4.8 mmol/L (ref 3.5–5.1)
Sodium: 137 mmol/L (ref 135–145)

## 2022-05-03 LAB — LIPASE, BLOOD: Lipase: 24 U/L (ref 11–51)

## 2022-05-03 LAB — TROPONIN I (HIGH SENSITIVITY)
Troponin I (High Sensitivity): 8 ng/L (ref <18)
Troponin I (High Sensitivity): 7 ng/L (ref <18)

## 2022-05-03 NOTE — ED Provider Triage Note (Signed)
Emergency Medicine Provider Triage Evaluation Note  Alex Carlson , a 81 y.o. male  was evaluated in triage.  Pt complains of central chest pain graduallu improving since last night. Reports it was sore and tight. Denies SOB or palpations. Denies any nausea or vomiting. Denies any cough or fevers. Denies any lightheadedness.  Review of Systems  Positive:  Negative:  Physical Exam  BP (!) 159/93   Pulse 90   Temp 98.5 F (36.9 C) (Oral)   Resp 18   SpO2 97%  Gen:   Awake, no distress   Resp:  Normal effort  MSK:   Moves extremities without difficulty  Other:  RRR  Medical Decision Making  Medically screening exam initiated at 2:19 PM.  Appropriate orders placed.  Alex Carlson was informed that the remainder of the evaluation will be completed by another provider, this initial triage assessment does not replace that evaluation, and the importance of remaining in the ED until their evaluation is complete.  CP work up    [name], [place] 05/03/22 1420

## 2022-05-03 NOTE — ED Triage Notes (Signed)
Patient here with complaint of constant epigastric soreness that started yesterday and is exacerbated by taking deep breaths. Patient is alert, oriented, ambulatory, and in no apparent distress at this time.

## 2022-05-04 DIAGNOSIS — R072 Precordial pain: Secondary | ICD-10-CM | POA: Diagnosis not present

## 2022-05-04 LAB — D-DIMER, QUANTITATIVE: D-Dimer, Quant: 0.37 ug/mL-FEU (ref 0.00–0.50)

## 2022-05-04 NOTE — ED Provider Notes (Signed)
[facility] EMERGENCY DEPARTMENT Provider Note   CSN: [id number] Arrival date & time: 05/03/22  1339     History  Chief Complaint  Patient presents with   Chest Pain    Alex Carlson is a 81 y.o. male.  The history is provided by the patient, medical records and the spouse.  Chest Pain Alex Carlson is a 81 y.o. male who presents to the Emergency Department complaining of chest pain.  He presents to the emergency department accompanied by his wife for evaluation of central chest pain that started at 10 PM on Thursday night.  Pain is described as a soreness that is worse with breathing as well as bending forward.  Pain is essentially constant but is overall improving.  He was evaluated at his PCPs office earlier today for pain and was referred to the emergency department due to EKG with new left bundle branch block.  No associated fever, cough, diaphoresis, abdominal pain, nausea, vomiting.  He has a history of diabetes, hypertension.  No known history of coronary artery disease.  No history of DVT/PE.  No prior similar symptoms.     Home Medications Prior to Admission medications   Medication Sig Start Date End Date Taking? Authorizing Provider  acetaminophen (TYLENOL) 650 MG CR tablet Take 1,300 mg by mouth every 8 (eight) hours as needed for pain.    [provider]  amLODipine (NORVASC) 5 MG tablet Take 5 mg by mouth daily.  08/17/16   [provider]  aspirin 81 MG chewable tablet Chew 1 tablet (81 mg total) by mouth 2 (two) times daily. 07/31/19   Mcarthur Rossetti, MD  Calcium Carbonate-Vit D-Min (CALCIUM 1200 PO) Take 1,200 mg by mouth 2 (two) times daily.    [provider]  Cholecalciferol (VITAMIN D3) 1000 units CAPS Take 1,000 Units by mouth every evening.    [provider]  GLIPIZIDE XL 10 MG 24 hr tablet Take 10 mg by mouth daily with breakfast.  06/15/16   [provider]  lovastatin (MEVACOR) 40  MG tablet Take 40 mg by mouth daily.  07/25/16   [provider]  metFORMIN (GLUCOPHAGE-XR) 500 MG 24 hr tablet Take 500 mg by mouth 2 (two) times daily. 07/02/19   [provider]  Omega-3 Fatty Acids ([employer] FISH OIL) 1200 MG CAPS Take 1,200 mg by mouth 2 (two) times daily.    [provider]      Allergies    Lisinopril    Review of Systems   Review of Systems  Cardiovascular:  Positive for chest pain.  All other systems reviewed and are negative.   Physical Exam Updated Vital Signs BP 132/87   Pulse 69   Temp 98.6 F (37 C)   Resp 18   SpO2 98%  Physical Exam Vitals and nursing note reviewed.  Constitutional:      Appearance: He is well-developed.  HENT:     Head: Normocephalic and atraumatic.  Cardiovascular:     Rate and Rhythm: Normal rate and regular rhythm.     Heart sounds: No murmur heard. Pulmonary:     Effort: Pulmonary effort is normal. No respiratory distress.     Breath sounds: Normal breath sounds.  Chest:     Chest wall: No tenderness.  Abdominal:     Palpations: Abdomen is soft.     Tenderness: There is no abdominal tenderness. There is no guarding or rebound.  Musculoskeletal:  General: No swelling or tenderness.  Skin:    General: Skin is warm and dry.  Neurological:     Mental Status: He is alert and oriented to person, place, and time.  Psychiatric:        Behavior: Behavior normal.     ED Results / Procedures / Treatments   Labs (all labs ordered are listed, but only abnormal results are displayed) Labs Reviewed  BASIC METABOLIC PANEL - Abnormal; Notable for the following components:      Result Value   CO2 20 (*)    Glucose, Bld 212 (*)    All other components within normal limits  CBC  LIPASE, BLOOD  D-DIMER, QUANTITATIVE  TROPONIN I (HIGH SENSITIVITY)  TROPONIN I (HIGH SENSITIVITY)    EKG EKG Interpretation  Date/Time:  Friday May 03 2022 13:49:20 EDT Ventricular Rate:  88 PR  Interval:  180 QRS Duration: 132 QT Interval:  384 QTC Calculation: 464 R Axis:   -19 Text Interpretation: Normal sinus rhythm Left bundle branch block Abnormal ECG  LBBB is new compared to prior Confirmed by Quintella Reichert [phone]) on 05/03/2022 11:25:08 PM  Radiology DG Chest 2 View  Result Date: 05/03/2022 CLINICAL DATA:  Chest pain EXAM: CHEST - 2 VIEW COMPARISON:  Study done earlier today FINDINGS: The heart size and mediastinal contours are within normal limits. Both lungs are clear. The visualized skeletal structures are unremarkable. IMPRESSION: No active cardiopulmonary disease. Electronically Signed   By: Elmer Picker M.D.   On: 05/03/2022 14:57    Procedures Procedures    Medications Ordered in ED Medications - No data to display  ED Course/ Medical Decision Making/ A&P                           Medical Decision Making Amount and/or Complexity of Data Reviewed Labs: ordered.   Patient here for evaluation of central chest pain that has been constant for greater than 24 hours.  EKG does demonstrate left bundle branch block, which is new when compared to 2020.  Troponins are negative x2 and patient is well perfused on evaluation and in no acute distress.  D-dimer is negative, doubt PE.  No evidence of pneumonia.  Presentation is not consistent with dissection.  Patient does have a heart score of 4 given his risk factors and EKG findings.  Discussed the patient's presentation with on-call cardiologist.  Given atypical symptoms, negative troponins feel patient is stable for cardiology follow-up.  Discussed with patient home care for chest pain as well as return precautions.        Final Clinical Impression(s) / ED Diagnoses Final diagnoses:  Precordial pain    Rx / DC Orders ED Discharge Orders          Ordered    Ambulatory referral to Cardiology        05/04/22 0148              Quintella Reichert, MD 05/04/22 [phone]

## 2022-05-04 NOTE — Plan of Care (Signed)
Notified about this patient presenting to emergency room with chest pain. Troponin negative x2. EKG with left bundle branch block. 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 No evidence of ACS at this time. Keep to follow-up with cardiology as an outpatient

## 2022-05-06 DIAGNOSIS — I447 Left bundle-branch block, unspecified: Secondary | ICD-10-CM | POA: Diagnosis not present

## 2022-05-06 DIAGNOSIS — R0789 Other chest pain: Secondary | ICD-10-CM | POA: Diagnosis not present

## 2022-05-06 DIAGNOSIS — Z6827 Body mass index (BMI) 27.0-27.9, adult: Secondary | ICD-10-CM | POA: Diagnosis not present

## 2022-05-12 DIAGNOSIS — L089 Local infection of the skin and subcutaneous tissue, unspecified: Secondary | ICD-10-CM | POA: Diagnosis not present

## 2022-05-12 DIAGNOSIS — S80862A Insect bite (nonvenomous), left lower leg, initial encounter: Secondary | ICD-10-CM | POA: Diagnosis not present

## 2022-05-12 DIAGNOSIS — W57XXXA Bitten or stung by nonvenomous insect and other nonvenomous arthropods, initial encounter: Secondary | ICD-10-CM | POA: Diagnosis not present

## 2022-05-14 NOTE — Progress Notes (Unsigned)
Cardiology Office Note   Date:  05/14/2022   ID:  Alex Carlson, DOB 04-Sep-1940, MRN [id number]  PCP:  Cari Caraway, MD  Cardiologist:   None Referring:  ***  No chief complaint on file.     History of Present Illness: Alex Carlson is a 81 y.o. male who was referred for evaluation of chest pain.   She is referred by *** . She was in the ED for this.  I reviewed these records for this visit.   EKG demonstrated LBBB.  There was no objective evidence of ischemia.   ***     Past Medical History:  Diagnosis Date   Arthritis    "hips" (10/09/2016)   GERD (gastroesophageal reflux disease)    High cholesterol    Hypertension    Type II diabetes mellitus ([place])     Past Surgical History:  Procedure Laterality Date   COLONOSCOPY     JOINT REPLACEMENT     TOTAL HIP ARTHROPLASTY Right 10/08/2016   Procedure: RIGHT TOTAL HIP ARTHROPLASTY ANTERIOR APPROACH;  Surgeon: Mcarthur Rossetti, MD;  Location: [facility];  Service: Orthopedics;  Laterality: Right;   TOTAL HIP ARTHROPLASTY Left 07/30/2019   Procedure: LEFT TOTAL HIP ARTHROPLASTY ANTERIOR APPROACH;  Surgeon: Mcarthur Rossetti, MD;  Location: [facility];  Service: Orthopedics;  Laterality: Left;     Current Outpatient Medications  Medication Sig Dispense Refill   acetaminophen (TYLENOL) 650 MG CR tablet Take 1,300 mg by mouth every 8 (eight) hours as needed for pain.     amLODipine (NORVASC) 5 MG tablet Take 5 mg by mouth daily.      aspirin 81 MG chewable tablet Chew 1 tablet (81 mg total) by mouth 2 (two) times daily. 30 tablet 0   Calcium Carbonate-Vit D-Min (CALCIUM 1200 PO) Take 1,200 mg by mouth 2 (two) times daily.     Cholecalciferol (VITAMIN D3) 1000 units CAPS Take 1,000 Units by mouth every evening.     GLIPIZIDE XL 10 MG 24 hr tablet Take 10 mg by mouth daily with breakfast.      lovastatin (MEVACOR) 40 MG tablet Take 40 mg by mouth daily.      metFORMIN (GLUCOPHAGE-XR) 500 MG 24 hr tablet Take 500 mg by  mouth 2 (two) times daily.     Omega-3 Fatty Acids ([employer]S FISH OIL) 1200 MG CAPS Take 1,200 mg by mouth 2 (two) times daily.     No current facility-administered medications for this visit.    Allergies:   Lisinopril    Social History:  The patient  reports that he has quit smoking. His smoking use included cigarettes. He has never used smokeless tobacco. He reports current alcohol use of about 4.0 standard drinks of alcohol per week. He reports that he does not use drugs.   Family History:  The patient's ***family history is not on file.    ROS:  Please see the history of present illness.   Otherwise, review of systems are positive for {NONE DEFAULTED:18576}.   All other systems are reviewed and negative.    PHYSICAL EXAM: VS:  There were no vitals taken for this visit. , BMI There is no height or weight on file to calculate BMI. GENERAL:  Well appearing HEENT:  Pupils equal round and reactive, fundi not visualized, oral mucosa unremarkable NECK:  No jugular venous distention, waveform within normal limits, carotid upstroke brisk and symmetric, no bruits, no thyromegaly LYMPHATICS:  No cervical, inguinal adenopathy LUNGS:  Clear to  auscultation bilaterally BACK:  No CVA tenderness CHEST:  Unremarkable HEART:  PMI not displaced or sustained,S1 and S2 within normal limits, no S3, no S4, no clicks, no rubs, *** murmurs ABD:  Flat, positive bowel sounds normal in frequency in pitch, no bruits, no rebound, no guarding, no midline pulsatile mass, no hepatomegaly, no splenomegaly EXT:  2 plus pulses throughout, no edema, no cyanosis no clubbing SKIN:  No rashes no nodules NEURO:  Cranial nerves II through XII grossly intact, motor grossly intact throughout PSYCH:  Cognitively intact, oriented to person place and time    EKG:  EKG {ACTION; IS/IS FQM:21031281} ordered today. The ekg ordered today demonstrates ***   Recent Labs: 05/03/2022: BUN 12; Creatinine, Ser 0.99; Hemoglobin 14.6;  Platelets 236; Potassium 4.8; Sodium 137    Lipid Panel No results found for: "CHOL", "TRIG", "HDL", "CHOLHDL", "VLDL", "LDLCALC", "LDLDIRECT"    Wt Readings from Last 3 Encounters:  07/30/19 176 lb (79.8 kg)  07/27/19 176 lb (79.8 kg)  10/09/16 191 lb 3.2 oz (86.7 kg)      Other studies Reviewed: Additional studies/ records that were reviewed today include: ***. Review of the above records demonstrates:  Please see elsewhere in the note.  ***   ASSESSMENT AND PLAN:  Chest pain:  ***   Current medicines are reviewed at length with the patient today.  The patient {ACTIONS; HAS/DOES NOT HAVE:19233} concerns regarding medicines.  The following changes have been made:  {PLAN; NO CHANGE:13088:s}  Labs/ tests ordered today include: *** No orders of the defined types were placed in this encounter.    Disposition:   FU with ***    Signed, Minus Breeding, MD  05/14/2022 10:00 PM    [place]

## 2022-05-16 ENCOUNTER — Ambulatory Visit: Payer: Medicare Other | Attending: Cardiology | Admitting: Cardiology

## 2022-05-16 ENCOUNTER — Encounter: Payer: Self-pay | Admitting: Cardiology

## 2022-05-16 VITALS — BP 138/84 | HR 90 | Ht 70.0 in | Wt 186.4 lb

## 2022-05-16 DIAGNOSIS — R072 Precordial pain: Secondary | ICD-10-CM | POA: Insufficient documentation

## 2022-05-16 DIAGNOSIS — I447 Left bundle-branch block, unspecified: Secondary | ICD-10-CM | POA: Insufficient documentation

## 2022-05-16 MED ORDER — METOPROLOL TARTRATE 100 MG PO TABS: 100.0000 mg | ORAL_TABLET | Freq: Once | ORAL | 0 refills | Status: AC

## 2022-05-16 NOTE — Patient Instructions (Addendum)
Medication Instructions:  NO CHANGES  You will need to take a one-time dose of metoprolol tartrate TWO HOURS before your CT test  *If you need a refill on your cardiac medications before your next appointment, please call your pharmacy*   Lab Work: Non-Fasting BMET -- prior to CT test  If you have labs (blood work) drawn today and your tests are completely normal, you will receive your results only by: [place] (if you have MyChart) OR A paper copy in the mail If you have any lab test that is abnormal or we need to change your treatment, we will call you to review the results.   Testing/Procedures: Coronary CT at [facility], you and your health needs are our priority.  As part of our continuing mission to provide you with exceptional heart care, we have created designated Provider Care Teams.  These Care Teams include your primary Cardiologist (physician) and Advanced Practice Providers (APPs -  Physician Assistants and Nurse Practitioners) who all work together to provide you with the care you need, when you need it.  We recommend signing up for the patient portal called "MyChart".  Sign up information is provided on this After Visit Summary.  MyChart is used to connect with patients for Virtual Visits (Telemedicine).  Patients are able to view lab/test results, encounter notes, upcoming appointments, etc.  Non-urgent messages can be sent to your provider as well.   To learn more about what you can do with MyChart, go to [email or web address].    Your next appointment:    6 months with Dr. Percival Spanish  Other Instructions   Your cardiac CT will be scheduled at one of the below locations:   [facility] [address] [place], [place] [address] [phone]  [facility] [address] [place], [place] [address] [phone]  [place], [place] [address] [phone]  If scheduled at [facility], please arrive at the [facility] and Children's Entrance (Entrance C2) of [facility] 30 minutes prior to test start time. You can use the FREE valet parking offered at entrance C (encouraged to control the heart rate for the test)  Proceed to the [facility] Radiology Department (first floor) to check-in and test prep.  All radiology patients and guests should use entrance C2 at [facility], accessed from [facility], even though the hospital's physical address listed is [address].    If scheduled at [facility] or [facility], please arrive 15 mins early for check-in and test prep.   Please follow these instructions carefully (unless otherwise directed):  Hold all erectile dysfunction medications at least 3 days (72 hrs) prior to test. (Ie viagra, cialis, sildenafil, tadalafil, etc) We will administer nitroglycerin during this exam.   On the Night Before the Test: Be sure to Drink plenty of water. Do not consume any caffeinated/decaffeinated beverages or chocolate 12 hours prior to your test. Do not take any antihistamines 12 hours prior to your test.  On the Day of the Test: Drink plenty of water until 1 hour prior to the test. You may take your regular medications prior to the test.  Take metoprolol (Lopressor) two hours prior to test. HOLD Furosemide/Hydrochlorothiazide morning of the test.  After the Test: Drink plenty of water. After receiving IV contrast, you may  experience a mild flushed feeling. This is normal. On occasion, you may experience a mild rash up to 24 hours after the test. This is not dangerous. If this occurs, you can take Benadryl 25 mg and increase your fluid intake. If you experience trouble breathing, this can be serious. If it is severe call 911 IMMEDIATELY. If it is  mild, please call our office. If you take any of these medications: Glipizide/Metformin, Avandament, Glucavance, please do not take 48 hours after completing test unless otherwise instructed.  We will call to schedule your test 2-4 weeks out understanding that some insurance companies will need an authorization prior to the service being performed.   For non-scheduling related questions, please contact the cardiac imaging nurse navigator should you have any questions/concerns: Marchia Bond, Cardiac Imaging Nurse Navigator Gordy Clement, Cardiac Imaging Nurse Navigator [facility] Direct Office Dial: [phone]   For scheduling needs, including cancellations and rescheduling, please call [place], [phone].

## 2022-05-17 ENCOUNTER — Telehealth (HOSPITAL_COMMUNITY): Payer: Self-pay | Admitting: *Deleted

## 2022-05-17 DIAGNOSIS — I447 Left bundle-branch block, unspecified: Secondary | ICD-10-CM | POA: Diagnosis not present

## 2022-05-17 DIAGNOSIS — R072 Precordial pain: Secondary | ICD-10-CM | POA: Diagnosis not present

## 2022-05-17 NOTE — Telephone Encounter (Signed)
Attempted to call patient regarding upcoming cardiac CT appointment. °Left message on voicemail with name and callback number ° °Keilly Fatula RN Navigator Cardiac Imaging °[facility] °[phone] Office °[phone] Cell ° °

## 2022-05-18 LAB — BASIC METABOLIC PANEL
BUN/Creatinine Ratio: 13 (ref 10–24)
BUN: 15 mg/dL (ref 8–27)
CO2: 20 mmol/L (ref 20–29)
Calcium: 10 mg/dL (ref 8.6–10.2)
Chloride: 100 mmol/L (ref 96–106)
Creatinine, Ser: 1.13 mg/dL (ref 0.76–1.27)
Glucose: 230 mg/dL — ABNORMAL HIGH (ref 70–99)
Potassium: 4.8 mmol/L (ref 3.5–5.2)
Sodium: 137 mmol/L (ref 134–144)
eGFR: 64 mL/min/{1.73_m2} (ref >59)

## 2022-05-20 ENCOUNTER — Ambulatory Visit (HOSPITAL_COMMUNITY)
Admission: RE | Admit: 2022-05-20 | Discharge: 2022-05-20 | Disposition: A | Discharge status: Home / Self Care | Payer: Medicare Other | Source: Ambulatory Visit | Attending: Cardiology | Admitting: Cardiology

## 2022-05-20 DIAGNOSIS — I447 Left bundle-branch block, unspecified: Secondary | ICD-10-CM | POA: Insufficient documentation

## 2022-05-20 DIAGNOSIS — I2510 Atherosclerotic heart disease of native coronary artery without angina pectoris: Secondary | ICD-10-CM

## 2022-05-20 DIAGNOSIS — R072 Precordial pain: Secondary | ICD-10-CM | POA: Insufficient documentation

## 2022-05-20 MED ORDER — METOPROLOL TARTRATE 5 MG/5ML IV SOLN: 10.0000 mg | INTRAVENOUS | Status: DC | PRN

## 2022-05-20 MED ORDER — NITROGLYCERIN 0.4 MG SL SUBL
0.8000 mg | SUBLINGUAL_TABLET | Freq: Once | SUBLINGUAL | Status: AC
  Administered 2022-05-20: 0.8 mg via SUBLINGUAL

## 2022-05-20 MED ORDER — IOHEXOL 350 MG/ML SOLN
100.0000 mL | Freq: Once | INTRAVENOUS | Status: AC | PRN
  Administered 2022-05-20: 100 mL via INTRAVENOUS

## 2022-05-20 MED ORDER — NITROGLYCERIN 0.4 MG SL SUBL
SUBLINGUAL_TABLET | SUBLINGUAL | Status: AC
  Filled 2022-05-20: qty 2

## 2022-05-20 MED ORDER — METOPROLOL TARTRATE 5 MG/5ML IV SOLN
INTRAVENOUS | Status: AC
  Filled 2022-05-20: qty 10

## 2022-07-31 DIAGNOSIS — Z0000 Encounter for general adult medical examination without abnormal findings: Secondary | ICD-10-CM | POA: Diagnosis not present

## 2022-07-31 DIAGNOSIS — E1129 Type 2 diabetes mellitus with other diabetic kidney complication: Secondary | ICD-10-CM | POA: Diagnosis not present

## 2022-07-31 DIAGNOSIS — Z1389 Encounter for screening for other disorder: Secondary | ICD-10-CM | POA: Diagnosis not present

## 2022-07-31 DIAGNOSIS — Z6826 Body mass index (BMI) 26.0-26.9, adult: Secondary | ICD-10-CM | POA: Diagnosis not present

## 2022-07-31 DIAGNOSIS — R809 Proteinuria, unspecified: Secondary | ICD-10-CM | POA: Diagnosis not present

## 2022-08-06 DIAGNOSIS — E782 Mixed hyperlipidemia: Secondary | ICD-10-CM | POA: Diagnosis not present

## 2022-08-06 DIAGNOSIS — R809 Proteinuria, unspecified: Secondary | ICD-10-CM | POA: Diagnosis not present

## 2022-08-06 DIAGNOSIS — N401 Enlarged prostate with lower urinary tract symptoms: Secondary | ICD-10-CM | POA: Diagnosis not present

## 2022-08-06 DIAGNOSIS — I10 Essential (primary) hypertension: Secondary | ICD-10-CM | POA: Diagnosis not present

## 2022-08-06 DIAGNOSIS — M159 Polyosteoarthritis, unspecified: Secondary | ICD-10-CM | POA: Diagnosis not present

## 2022-08-06 DIAGNOSIS — I2510 Atherosclerotic heart disease of native coronary artery without angina pectoris: Secondary | ICD-10-CM | POA: Diagnosis not present

## 2022-08-06 DIAGNOSIS — Z6826 Body mass index (BMI) 26.0-26.9, adult: Secondary | ICD-10-CM | POA: Diagnosis not present

## 2022-08-06 DIAGNOSIS — K219 Gastro-esophageal reflux disease without esophagitis: Secondary | ICD-10-CM | POA: Diagnosis not present

## 2022-08-06 DIAGNOSIS — M179 Osteoarthritis of knee, unspecified: Secondary | ICD-10-CM | POA: Diagnosis not present

## 2022-08-06 DIAGNOSIS — E1129 Type 2 diabetes mellitus with other diabetic kidney complication: Secondary | ICD-10-CM | POA: Diagnosis not present

## 2022-09-18 DIAGNOSIS — E782 Mixed hyperlipidemia: Secondary | ICD-10-CM | POA: Diagnosis not present

## 2022-11-06 DIAGNOSIS — I447 Left bundle-branch block, unspecified: Secondary | ICD-10-CM | POA: Insufficient documentation

## 2022-11-06 DIAGNOSIS — R072 Precordial pain: Secondary | ICD-10-CM | POA: Insufficient documentation

## 2022-11-06 DIAGNOSIS — E785 Hyperlipidemia, unspecified: Secondary | ICD-10-CM | POA: Insufficient documentation

## 2022-11-06 DIAGNOSIS — E118 Type 2 diabetes mellitus with unspecified complications: Secondary | ICD-10-CM | POA: Insufficient documentation

## 2022-11-06 NOTE — Progress Notes (Signed)
Cardiology Office Note   Date:  11/08/2022   ID:  Alex Carlson, DOB 1941-01-08, MRN [id number]  PCP:  Alex Caraway, MD  Cardiologist:   None Referring:  Alex Caraway, MD  Chief Complaint  Patient presents with   Hip Pain      History of Present Illness: Alex Carlson is a 82 y.o. male who was referred for evaluation of chest pain.   He is referred by Alex Caraway, MD . She was in the ED for this.  I reviewed these records for this visit.   EKG demonstrated LBBB.  He did have coronary CT demonstrated calcium score 480 which was only 67 percentile for his age.  The LAD had 0 to 24% plaque.  There was moderate stenosis in obtuse marginal branch of 50 to 69%.  I do note that he had intermittent left bundle branch block but today is narrow complex.  He stays active.  He is a little bit limited by sciatica but he looks remarkably young for his age.  He has had none of the chest discomfort that he had previously.  He has had no new palpitations, presyncope or syncope.  He had no weight gain or edema.   Past Medical History:  Diagnosis Date   Arthritis    "hips" (10/09/2016)   GERD (gastroesophageal reflux disease)    High cholesterol    Hypertension    Type II diabetes mellitus ([place])     Past Surgical History:  Procedure Laterality Date   COLONOSCOPY     TOTAL HIP ARTHROPLASTY Right 10/08/2016   Procedure: RIGHT TOTAL HIP ARTHROPLASTY ANTERIOR APPROACH;  Surgeon: Mcarthur Rossetti, MD;  Location: [facility];  Service: Orthopedics;  Laterality: Right;   TOTAL HIP ARTHROPLASTY Left 07/30/2019   Procedure: LEFT TOTAL HIP ARTHROPLASTY ANTERIOR APPROACH;  Surgeon: Mcarthur Rossetti, MD;  Location: [facility];  Service: Orthopedics;  Laterality: Left;     Current Outpatient Medications  Medication Sig Dispense Refill   acetaminophen (TYLENOL) 650 MG CR tablet Take 1,300 mg by mouth every 8 (eight) hours as needed for pain.     amLODipine (NORVASC) 5 MG tablet Take 5 mg  by mouth daily.      amoxicillin (AMOXIL) 500 MG capsule SMARTSIG:4 Capsule(s) By Mouth     aspirin 81 MG chewable tablet Chew 1 tablet (81 mg total) by mouth 2 (two) times daily. 30 tablet 0   Calcium Carbonate-Vit D-Min (CALCIUM 1200 PO) Take 1,200 mg by mouth 2 (two) times daily.     Cholecalciferol (VITAMIN D3) 1000 units CAPS Take 1,000 Units by mouth every evening.     GLIPIZIDE XL 10 MG 24 hr tablet Take 10 mg by mouth daily with breakfast.      metFORMIN (GLUCOPHAGE-XR) 750 MG 24 hr tablet Take 750 mg by mouth 2 (two) times daily.     Omega-3 Fatty Acids ([employer]S FISH OIL) 1200 MG CAPS Take 1,200 mg by mouth 2 (two) times daily.     omeprazole (PRILOSEC) 20 MG capsule SMARTSIG:1 Capsule(s) By Mouth Every Evening     rosuvastatin (CRESTOR) 10 MG tablet Take 10 mg by mouth daily.     tamsulosin (FLOMAX) 0.4 MG CAPS capsule SMARTSIG:2 Capsule(s) By Mouth Every Evening     lovastatin (MEVACOR) 40 MG tablet Take 40 mg by mouth daily.  (Patient not taking: Reported on 11/08/2022)     metoprolol tartrate (LOPRESSOR) 100 MG tablet Take 1 tablet (100 mg total) by mouth once for  1 dose. Take TWO HOURS prior to CT test 1 tablet 0   No current facility-administered medications for this visit.    Allergies:   Lisinopril    ROS:  Please see the history of present illness.   Otherwise, review of systems are positive for none.   All other systems are reviewed and negative.    PHYSICAL EXAM: VS:  BP (!) 170/90   Pulse 90   Ht 5\' 9"  (1.753 m)   Wt 177 lb 9.6 oz (80.6 kg)   SpO2 96%   BMI 26.23 kg/m  , BMI Body mass index is 26.23 kg/m. GENERAL:  Well appearing NECK:  No jugular venous distention, waveform within normal limits, carotid upstroke brisk and symmetric, no bruits, no thyromegaly LUNGS:  Clear to auscultation bilaterally CHEST:  Unremarkable HEART:  PMI not displaced or sustained,S1 and S2 within normal limits, no S3, no S4, no clicks, no rubs, no murmurs ABD:  Flat, positive bowel  sounds normal in frequency in pitch, no bruits, no rebound, no guarding, no midline pulsatile mass, no hepatomegaly, no splenomegaly EXT:  2 plus pulses throughout, no edema, no cyanosis no clubbing  EKG:  EKG is  ordered today. The ekg ordered today demonstrates sinus rhythm, rate 90, axis within normal limits, intervals within normal limits, no acute ST-T wave changes.   Recent Labs: 05/03/2022: Hemoglobin 14.6; Platelets 236 05/17/2022: BUN 15; Creatinine, Ser 1.13; Potassium 4.8; Sodium 137    Lipid Panel No results found for: "CHOL", "TRIG", "HDL", "CHOLHDL", "VLDL", "LDLCALC", "LDLDIRECT"    Wt Readings from Last 3 Encounters:  11/08/22 177 lb 9.6 oz (80.6 kg)  05/16/22 186 lb 6.4 oz (84.6 kg)  07/30/19 176 lb (79.8 kg)      Other studies Reviewed: Additional studies/ records that were reviewed today include:Labs. Review of the above records demonstrates:  Please see elsewhere in the note.     ASSESSMENT AND PLAN:  Chest pain:   He had no further symptoms.  He has nonobstructive branch vessel disease as described.  He will continue with risk reduction.   Left bundle branch block:   This seems to be intermittent.  No further workup.  He is having no symptomatic bradycardia arrhythmias  Diabetes mellitus A1c was last down to 7.1.   Dyslipidemia: HDL was 61.  I do not see the most recent LDL but total was 147 so I think it is ratio is excellent.  No change in therapy.   Hypertension: Elevated but he says it is always well-controlled at home and we gave him paperwork for a blood pressure diary that he will send to me.  He might need med titration.    Current medicines are reviewed at length with the patient today.  The patient does not have concerns regarding medicines.  The following changes have been made:  None  Labs/ tests ordered today include: None  No orders of the defined types were placed in this encounter.    Disposition:   FU with me as needed.      Signed, Minus Breeding, MD  11/08/2022 10:41 AM    [place]

## 2022-11-08 ENCOUNTER — Encounter: Payer: Self-pay | Admitting: Cardiology

## 2022-11-08 ENCOUNTER — Ambulatory Visit: Payer: Federal, State, Local not specified - PPO | Attending: Cardiology | Admitting: Cardiology

## 2022-11-08 VITALS — BP 170/90 | HR 90 | Ht 69.0 in | Wt 177.6 lb

## 2022-11-08 DIAGNOSIS — E118 Type 2 diabetes mellitus with unspecified complications: Secondary | ICD-10-CM | POA: Diagnosis not present

## 2022-11-08 DIAGNOSIS — I10 Essential (primary) hypertension: Secondary | ICD-10-CM | POA: Diagnosis not present

## 2022-11-08 DIAGNOSIS — I447 Left bundle-branch block, unspecified: Secondary | ICD-10-CM

## 2022-11-08 DIAGNOSIS — R072 Precordial pain: Secondary | ICD-10-CM

## 2022-11-08 DIAGNOSIS — E785 Hyperlipidemia, unspecified: Secondary | ICD-10-CM | POA: Diagnosis not present

## 2022-11-08 NOTE — Patient Instructions (Signed)
Medication Instructions:  Your physician recommends that you continue on your current medications as directed. Please refer to the Current Medication list given to you today.  *If you need a refill on your cardiac medications before your next appointment, please call your pharmacy*   Follow-Up: At [facility], you and your health needs are our priority.  As part of our continuing mission to provide you with exceptional heart care, we have created designated Provider Care Teams.  These Care Teams include your primary Cardiologist (physician) and Advanced Practice Providers (APPs -  Physician Assistants and Nurse Practitioners) who all work together to provide you with the care you need, when you need it.  We recommend signing up for the patient portal called "MyChart".  Sign up information is provided on this After Visit Summary.  MyChart is used to connect with patients for Virtual Visits (Telemedicine).  Patients are able to view lab/test results, encounter notes, upcoming appointments, etc.  Non-urgent messages can be sent to your provider as well.   To learn more about what you can do with MyChart, go to [email or web address].    Your next appointment:   We will see you on an as needed basis.   Provider:   Minus Breeding, MD    Other Instructions Dr. Percival Spanish would like for you to keep a blood pressure log and send it in for review.  HOW TO TAKE YOUR BLOOD PRESSURE: Rest 5 minutes before taking your blood pressure. Don't smoke or drink caffeinated beverages for at least 30 minutes before. Take your blood pressure before (not after) you eat. Sit comfortably with your back supported and both feet on the floor (don't cross your legs). Elevate your arm to heart level on a table or a desk. Use the proper sized cuff. It should fit smoothly and snugly around your bare upper arm. There should be enough room to slip a fingertip under the cuff. The bottom edge of the cuff should be 1  inch above the crease of the elbow. Ideally, take 3 measurements at one sitting and record the average.

## 2022-11-12 NOTE — Addendum Note (Signed)
Addended by: Sharee Holster R on: 11/12/2022 09:14 AM   Modules accepted: Orders

## 2022-11-26 ENCOUNTER — Telehealth: Payer: Self-pay | Admitting: Cardiology

## 2022-11-26 NOTE — Telephone Encounter (Signed)
Patient has BP readings for the last two weeks that he needs to give to Dr. Antoine Poche. Patient would like to know if he can just drop those readings off at the office. Please advise.

## 2022-11-26 NOTE — Telephone Encounter (Signed)
Spoke with pt regarding his blood pressure log. Advised pt he can upload these to mychart. Pt plans to bring his blood pressure log on Thursday to leave for Dr. Antoine Poche to review.

## 2022-11-28 ENCOUNTER — Telehealth: Payer: Self-pay | Admitting: Cardiology

## 2022-11-28 NOTE — Telephone Encounter (Signed)
Paper Work Dropped Off: Blood Pressure results per patient  Date: 04.11.24   Location of paper:  put in dr's mailbox [place]

## 2022-11-29 NOTE — Telephone Encounter (Signed)
Called left message for patient to return call,  Dr Antoine Poche reviewed  recent blood pressure readings from  11/10/22 to 11/23/22   Range from 12/69 -160 /84  Per Dr Antoine Poche  patient will need to increase Amlodipine from 5 mg ( 1 tablet) to 7.5 mg  ( 1.5 mg ) daily

## 2022-12-11 NOTE — Telephone Encounter (Signed)
Pt calling back to speak to RN about his bp readings Dr. Antoine Poche suggestions

## 2022-12-11 NOTE — Telephone Encounter (Signed)
Called patient, advised of message below from MD.   Patient verbalized understanding.   

## 2023-01-07 ENCOUNTER — Telehealth: Payer: Self-pay | Admitting: Cardiology

## 2023-01-07 MED ORDER — AMLODIPINE BESYLATE 5 MG PO TABS: 5.0000 mg | ORAL_TABLET | Freq: Every day | ORAL | 3 refills | Status: DC

## 2023-01-07 NOTE — Telephone Encounter (Signed)
*  STAT* If patient is at the pharmacy, call can be transferred to refill team.   1. Which medications need to be refilled? (please list name of each medication and dose if known) amLODipine (NORVASC) 5 MG tablet   2. Which pharmacy/location (including street and city if local pharmacy) is medication to be sent to?  [facility] [place], [place] - [address]    3. Do they need a 30 day or 90 day supply? 90 day

## 2023-01-07 NOTE — Telephone Encounter (Signed)
Patient called again stating his amLODipine (NORVASC) 5 MG tablet medication was increased and he is running out of this medication.  Patient stated he has about a week's worth of this medication left.

## 2023-01-08 ENCOUNTER — Other Ambulatory Visit: Payer: Self-pay

## 2023-01-08 MED ORDER — AMLODIPINE BESYLATE 5 MG PO TABS: 7.5000 mg | ORAL_TABLET | Freq: Every day | ORAL | 3 refills | Status: AC

## 2023-01-08 NOTE — Telephone Encounter (Signed)
Mediation to sent to preferred pharmacy per Dr. [place] Lions, amlodipine 7.5 (1.5) tablet daily

## 2023-01-24 DIAGNOSIS — E782 Mixed hyperlipidemia: Secondary | ICD-10-CM | POA: Diagnosis not present

## 2023-01-24 DIAGNOSIS — E1129 Type 2 diabetes mellitus with other diabetic kidney complication: Secondary | ICD-10-CM | POA: Diagnosis not present

## 2023-01-24 DIAGNOSIS — R809 Proteinuria, unspecified: Secondary | ICD-10-CM | POA: Diagnosis not present

## 2023-01-29 DIAGNOSIS — E1129 Type 2 diabetes mellitus with other diabetic kidney complication: Secondary | ICD-10-CM | POA: Diagnosis not present

## 2023-01-29 DIAGNOSIS — E782 Mixed hyperlipidemia: Secondary | ICD-10-CM | POA: Diagnosis not present

## 2023-01-29 DIAGNOSIS — K219 Gastro-esophageal reflux disease without esophagitis: Secondary | ICD-10-CM | POA: Diagnosis not present

## 2023-01-29 DIAGNOSIS — N401 Enlarged prostate with lower urinary tract symptoms: Secondary | ICD-10-CM | POA: Diagnosis not present

## 2023-01-29 DIAGNOSIS — N400 Enlarged prostate without lower urinary tract symptoms: Secondary | ICD-10-CM | POA: Diagnosis not present

## 2023-01-29 DIAGNOSIS — I10 Essential (primary) hypertension: Secondary | ICD-10-CM | POA: Diagnosis not present

## 2023-01-29 DIAGNOSIS — M549 Dorsalgia, unspecified: Secondary | ICD-10-CM | POA: Diagnosis not present

## 2023-01-29 DIAGNOSIS — I2510 Atherosclerotic heart disease of native coronary artery without angina pectoris: Secondary | ICD-10-CM | POA: Diagnosis not present

## 2023-01-29 DIAGNOSIS — E559 Vitamin D deficiency, unspecified: Secondary | ICD-10-CM | POA: Diagnosis not present

## 2023-01-29 DIAGNOSIS — Z23 Encounter for immunization: Secondary | ICD-10-CM | POA: Diagnosis not present

## 2023-02-27 DIAGNOSIS — H66001 Acute suppurative otitis media without spontaneous rupture of ear drum, right ear: Secondary | ICD-10-CM | POA: Diagnosis not present

## 2023-02-27 DIAGNOSIS — H6991 Unspecified Eustachian tube disorder, right ear: Secondary | ICD-10-CM | POA: Diagnosis not present

## 2023-03-19 DIAGNOSIS — H26493 Other secondary cataract, bilateral: Secondary | ICD-10-CM | POA: Diagnosis not present

## 2023-03-19 DIAGNOSIS — Z961 Presence of intraocular lens: Secondary | ICD-10-CM | POA: Diagnosis not present

## 2023-03-19 DIAGNOSIS — E119 Type 2 diabetes mellitus without complications: Secondary | ICD-10-CM | POA: Diagnosis not present

## 2023-03-19 DIAGNOSIS — H35033 Hypertensive retinopathy, bilateral: Secondary | ICD-10-CM | POA: Diagnosis not present

## 2023-07-07 DIAGNOSIS — L723 Sebaceous cyst: Secondary | ICD-10-CM | POA: Diagnosis not present

## 2023-07-07 DIAGNOSIS — L089 Local infection of the skin and subcutaneous tissue, unspecified: Secondary | ICD-10-CM | POA: Diagnosis not present

## 2023-07-21 DIAGNOSIS — L02212 Cutaneous abscess of back [any part, except buttock]: Secondary | ICD-10-CM | POA: Diagnosis not present

## 2023-07-21 DIAGNOSIS — L089 Local infection of the skin and subcutaneous tissue, unspecified: Secondary | ICD-10-CM | POA: Diagnosis not present

## 2023-08-04 DIAGNOSIS — Z1331 Encounter for screening for depression: Secondary | ICD-10-CM | POA: Diagnosis not present

## 2023-08-04 DIAGNOSIS — Z6827 Body mass index (BMI) 27.0-27.9, adult: Secondary | ICD-10-CM | POA: Diagnosis not present

## 2023-08-04 DIAGNOSIS — Z0000 Encounter for general adult medical examination without abnormal findings: Secondary | ICD-10-CM | POA: Diagnosis not present

## 2023-08-06 DIAGNOSIS — I2510 Atherosclerotic heart disease of native coronary artery without angina pectoris: Secondary | ICD-10-CM | POA: Diagnosis not present

## 2023-08-06 DIAGNOSIS — E1129 Type 2 diabetes mellitus with other diabetic kidney complication: Secondary | ICD-10-CM | POA: Diagnosis not present

## 2023-08-06 DIAGNOSIS — N401 Enlarged prostate with lower urinary tract symptoms: Secondary | ICD-10-CM | POA: Diagnosis not present

## 2023-08-06 DIAGNOSIS — M179 Osteoarthritis of knee, unspecified: Secondary | ICD-10-CM | POA: Diagnosis not present

## 2023-08-06 DIAGNOSIS — E782 Mixed hyperlipidemia: Secondary | ICD-10-CM | POA: Diagnosis not present

## 2023-08-06 DIAGNOSIS — R801 Persistent proteinuria, unspecified: Secondary | ICD-10-CM | POA: Diagnosis not present

## 2023-08-06 DIAGNOSIS — I10 Essential (primary) hypertension: Secondary | ICD-10-CM | POA: Diagnosis not present

## 2023-08-06 DIAGNOSIS — H9193 Unspecified hearing loss, bilateral: Secondary | ICD-10-CM | POA: Diagnosis not present

## 2023-08-06 DIAGNOSIS — K219 Gastro-esophageal reflux disease without esophagitis: Secondary | ICD-10-CM | POA: Diagnosis not present

## 2023-08-06 DIAGNOSIS — E11319 Type 2 diabetes mellitus with unspecified diabetic retinopathy without macular edema: Secondary | ICD-10-CM | POA: Diagnosis not present

## 2023-08-06 DIAGNOSIS — Z6826 Body mass index (BMI) 26.0-26.9, adult: Secondary | ICD-10-CM | POA: Diagnosis not present

## 2023-10-08 DIAGNOSIS — R3912 Poor urinary stream: Secondary | ICD-10-CM | POA: Diagnosis not present

## 2023-10-08 DIAGNOSIS — N401 Enlarged prostate with lower urinary tract symptoms: Secondary | ICD-10-CM | POA: Diagnosis not present

## 2023-11-19 DIAGNOSIS — N401 Enlarged prostate with lower urinary tract symptoms: Secondary | ICD-10-CM | POA: Diagnosis not present

## 2023-11-19 DIAGNOSIS — R3912 Poor urinary stream: Secondary | ICD-10-CM | POA: Diagnosis not present

## 2023-11-20 DIAGNOSIS — R3912 Poor urinary stream: Secondary | ICD-10-CM | POA: Diagnosis not present

## 2023-11-20 DIAGNOSIS — N401 Enlarged prostate with lower urinary tract symptoms: Secondary | ICD-10-CM | POA: Diagnosis not present

## 2024-02-12 DIAGNOSIS — I10 Essential (primary) hypertension: Secondary | ICD-10-CM | POA: Diagnosis not present

## 2024-02-12 DIAGNOSIS — M21611 Bunion of right foot: Secondary | ICD-10-CM | POA: Diagnosis not present

## 2024-02-12 DIAGNOSIS — E782 Mixed hyperlipidemia: Secondary | ICD-10-CM | POA: Diagnosis not present

## 2024-02-12 DIAGNOSIS — M21612 Bunion of left foot: Secondary | ICD-10-CM | POA: Diagnosis not present

## 2024-02-12 DIAGNOSIS — R801 Persistent proteinuria, unspecified: Secondary | ICD-10-CM | POA: Diagnosis not present

## 2024-02-12 DIAGNOSIS — K219 Gastro-esophageal reflux disease without esophagitis: Secondary | ICD-10-CM | POA: Diagnosis not present

## 2024-02-12 DIAGNOSIS — Z79899 Other long term (current) drug therapy: Secondary | ICD-10-CM | POA: Diagnosis not present

## 2024-02-12 DIAGNOSIS — N401 Enlarged prostate with lower urinary tract symptoms: Secondary | ICD-10-CM | POA: Diagnosis not present

## 2024-02-12 DIAGNOSIS — Z6825 Body mass index (BMI) 25.0-25.9, adult: Secondary | ICD-10-CM | POA: Diagnosis not present

## 2024-02-12 DIAGNOSIS — E1129 Type 2 diabetes mellitus with other diabetic kidney complication: Secondary | ICD-10-CM | POA: Diagnosis not present

## 2024-02-12 DIAGNOSIS — M179 Osteoarthritis of knee, unspecified: Secondary | ICD-10-CM | POA: Diagnosis not present

## 2024-02-19 DIAGNOSIS — R801 Persistent proteinuria, unspecified: Secondary | ICD-10-CM | POA: Diagnosis not present

## 2024-06-10 DIAGNOSIS — K219 Gastro-esophageal reflux disease without esophagitis: Secondary | ICD-10-CM | POA: Diagnosis not present

## 2024-06-10 DIAGNOSIS — Z23 Encounter for immunization: Secondary | ICD-10-CM | POA: Diagnosis not present

## 2024-06-10 DIAGNOSIS — N401 Enlarged prostate with lower urinary tract symptoms: Secondary | ICD-10-CM | POA: Diagnosis not present

## 2024-06-10 DIAGNOSIS — E1129 Type 2 diabetes mellitus with other diabetic kidney complication: Secondary | ICD-10-CM | POA: Diagnosis not present

## 2024-06-10 DIAGNOSIS — Z6826 Body mass index (BMI) 26.0-26.9, adult: Secondary | ICD-10-CM | POA: Diagnosis not present

## 2024-06-10 DIAGNOSIS — M179 Osteoarthritis of knee, unspecified: Secondary | ICD-10-CM | POA: Diagnosis not present

## 2024-06-10 DIAGNOSIS — I10 Essential (primary) hypertension: Secondary | ICD-10-CM | POA: Diagnosis not present

## 2024-06-10 DIAGNOSIS — E782 Mixed hyperlipidemia: Secondary | ICD-10-CM | POA: Diagnosis not present
